# Patient Record
Sex: Male | Born: 1935 | Race: Black or African American | Hispanic: No | Smoking: Never smoker
Health system: Southern US, Community
[De-identification: ages and names within clinical notes are randomized; demographics above are authoritative.]

## PROBLEM LIST (undated history)

## (undated) DIAGNOSIS — M109 Gout, unspecified: Secondary | ICD-10-CM

## (undated) DIAGNOSIS — I10 Essential (primary) hypertension: Secondary | ICD-10-CM

## (undated) DIAGNOSIS — M1990 Unspecified osteoarthritis, unspecified site: Secondary | ICD-10-CM

## (undated) HISTORY — PX: OTHER SURGICAL HISTORY: SHX169

## (undated) HISTORY — PX: CHOLECYSTECTOMY: SHX55

## (undated) HISTORY — PX: KIDNEY STONE SURGERY: SHX686

## (undated) HISTORY — PX: HERNIA REPAIR: SHX51

---

## 2008-03-21 ENCOUNTER — Emergency Department: Payer: Self-pay | Admitting: Emergency Medicine

## 2009-10-18 ENCOUNTER — Emergency Department: Payer: Self-pay | Admitting: Emergency Medicine

## 2013-01-03 ENCOUNTER — Inpatient Hospital Stay: Payer: Self-pay | Admitting: Internal Medicine

## 2013-01-03 LAB — URINALYSIS, COMPLETE
Bilirubin,UR: NEGATIVE
Blood: NEGATIVE
Glucose,UR: NEGATIVE mg/dL (ref 0–75)
Hyaline Cast: 2
Ketone: NEGATIVE
Nitrite: NEGATIVE
Ph: 6 (ref 4.5–8.0)
Protein: NEGATIVE
RBC,UR: 5 /HPF (ref 0–5)
Specific Gravity: 1.021 (ref 1.003–1.030)
Squamous Epithelial: 6
WBC UR: 51 /HPF (ref 0–5)

## 2013-01-03 LAB — COMPREHENSIVE METABOLIC PANEL
Albumin: 3.3 g/dL — ABNORMAL LOW (ref 3.4–5.0)
Alkaline Phosphatase: 135 U/L (ref 50–136)
Anion Gap: 3 — ABNORMAL LOW (ref 7–16)
BUN: 24 mg/dL — ABNORMAL HIGH (ref 7–18)
Bilirubin,Total: 0.7 mg/dL (ref 0.2–1.0)
Calcium, Total: 8.7 mg/dL (ref 8.5–10.1)
Chloride: 102 mmol/L (ref 98–107)
Co2: 33 mmol/L — ABNORMAL HIGH (ref 21–32)
Creatinine: 1.77 mg/dL — ABNORMAL HIGH (ref 0.60–1.30)
EGFR (African American): 41 — ABNORMAL LOW
EGFR (Non-African Amer.): 36 — ABNORMAL LOW
Glucose: 108 mg/dL — ABNORMAL HIGH (ref 65–99)
Osmolality: 280 (ref 275–301)
Potassium: 3.7 mmol/L (ref 3.5–5.1)
SGOT(AST): 23 U/L (ref 15–37)
SGPT (ALT): 16 U/L (ref 12–78)
Sodium: 138 mmol/L (ref 136–145)
Total Protein: 8 g/dL (ref 6.4–8.2)

## 2013-01-03 LAB — CBC
HCT: 40.5 % (ref 40.0–52.0)
HGB: 13.3 g/dL (ref 13.0–18.0)
MCH: 30.6 pg (ref 26.0–34.0)
MCHC: 32.8 g/dL (ref 32.0–36.0)
MCV: 93 fL (ref 80–100)
Platelet: 446 10*3/uL — ABNORMAL HIGH (ref 150–440)
RBC: 4.34 10*6/uL — ABNORMAL LOW (ref 4.40–5.90)
RDW: 13.2 % (ref 11.5–14.5)
WBC: 15.2 10*3/uL — ABNORMAL HIGH (ref 3.8–10.6)

## 2013-01-04 LAB — CBC WITH DIFFERENTIAL/PLATELET
Basophil #: 0.1 10*3/uL (ref 0.0–0.1)
Basophil %: 0.6 %
Eosinophil #: 0.7 10*3/uL (ref 0.0–0.7)
Eosinophil %: 5.8 %
HCT: 33.9 % — ABNORMAL LOW (ref 40.0–52.0)
HGB: 11.4 g/dL — ABNORMAL LOW (ref 13.0–18.0)
Lymphocyte #: 1.7 10*3/uL (ref 1.0–3.6)
Lymphocyte %: 14.8 %
MCH: 30.9 pg (ref 26.0–34.0)
MCHC: 33.8 g/dL (ref 32.0–36.0)
MCV: 92 fL (ref 80–100)
Monocyte #: 1.2 x10 3/mm — ABNORMAL HIGH (ref 0.2–1.0)
Monocyte %: 10.5 %
Neutrophil #: 7.8 10*3/uL — ABNORMAL HIGH (ref 1.4–6.5)
Neutrophil %: 68.3 %
Platelet: 399 10*3/uL (ref 150–440)
RBC: 3.7 10*6/uL — ABNORMAL LOW (ref 4.40–5.90)
RDW: 12.9 % (ref 11.5–14.5)
WBC: 11.4 10*3/uL — ABNORMAL HIGH (ref 3.8–10.6)

## 2013-01-04 LAB — BASIC METABOLIC PANEL
Anion Gap: 5 — ABNORMAL LOW (ref 7–16)
BUN: 25 mg/dL — ABNORMAL HIGH (ref 7–18)
Calcium, Total: 8 mg/dL — ABNORMAL LOW (ref 8.5–10.1)
Chloride: 102 mmol/L (ref 98–107)
Co2: 31 mmol/L (ref 21–32)
Creatinine: 1.64 mg/dL — ABNORMAL HIGH (ref 0.60–1.30)
EGFR (African American): 45 — ABNORMAL LOW
EGFR (Non-African Amer.): 39 — ABNORMAL LOW
Glucose: 108 mg/dL — ABNORMAL HIGH (ref 65–99)
Osmolality: 281 (ref 275–301)
Potassium: 3.5 mmol/L (ref 3.5–5.1)
Sodium: 138 mmol/L (ref 136–145)

## 2013-01-05 LAB — BASIC METABOLIC PANEL
Anion Gap: 5 — ABNORMAL LOW (ref 7–16)
BUN: 22 mg/dL — ABNORMAL HIGH (ref 7–18)
Calcium, Total: 8.3 mg/dL — ABNORMAL LOW (ref 8.5–10.1)
Chloride: 104 mmol/L (ref 98–107)
Co2: 29 mmol/L (ref 21–32)
Creatinine: 1.45 mg/dL — ABNORMAL HIGH (ref 0.60–1.30)
EGFR (African American): 53 — ABNORMAL LOW
EGFR (Non-African Amer.): 45 — ABNORMAL LOW
Glucose: 117 mg/dL — ABNORMAL HIGH (ref 65–99)
Osmolality: 280 (ref 275–301)
Potassium: 3.7 mmol/L (ref 3.5–5.1)
Sodium: 138 mmol/L (ref 136–145)

## 2013-01-05 LAB — URINE CULTURE

## 2014-01-27 ENCOUNTER — Emergency Department: Payer: Self-pay | Admitting: Emergency Medicine

## 2014-01-27 LAB — URINALYSIS, COMPLETE
Bilirubin,UR: NEGATIVE
Blood: NEGATIVE
Glucose,UR: NEGATIVE mg/dL (ref 0–75)
Hyaline Cast: 2
Ketone: NEGATIVE
Nitrite: NEGATIVE
Ph: 5 (ref 4.5–8.0)
Protein: 30
RBC,UR: 3 /HPF (ref 0–5)
Specific Gravity: 1.017 (ref 1.003–1.030)
Squamous Epithelial: 1
WBC UR: 13 /HPF (ref 0–5)

## 2014-01-27 LAB — CBC WITH DIFFERENTIAL/PLATELET
Basophil #: 0.1 10*3/uL (ref 0.0–0.1)
Basophil %: 0.6 %
Eosinophil #: 0.1 10*3/uL (ref 0.0–0.7)
Eosinophil %: 1.5 %
HCT: 42.9 % (ref 40.0–52.0)
HGB: 14 g/dL (ref 13.0–18.0)
Lymphocyte #: 1 10*3/uL (ref 1.0–3.6)
Lymphocyte %: 10.2 %
MCH: 30.9 pg (ref 26.0–34.0)
MCHC: 32.7 g/dL (ref 32.0–36.0)
MCV: 95 fL (ref 80–100)
Monocyte #: 0.6 x10 3/mm (ref 0.2–1.0)
Monocyte %: 6.3 %
Neutrophil #: 7.7 10*3/uL — ABNORMAL HIGH (ref 1.4–6.5)
Neutrophil %: 81.4 %
Platelet: 281 10*3/uL (ref 150–440)
RBC: 4.54 10*6/uL (ref 4.40–5.90)
RDW: 13 % (ref 11.5–14.5)
WBC: 9.5 10*3/uL (ref 3.8–10.6)

## 2014-01-27 LAB — COMPREHENSIVE METABOLIC PANEL
Albumin: 3.3 g/dL — ABNORMAL LOW (ref 3.4–5.0)
Alkaline Phosphatase: 129 U/L — ABNORMAL HIGH
Anion Gap: 4 — ABNORMAL LOW (ref 7–16)
BUN: 23 mg/dL — ABNORMAL HIGH (ref 7–18)
Bilirubin,Total: 0.6 mg/dL (ref 0.2–1.0)
Calcium, Total: 8.1 mg/dL — ABNORMAL LOW (ref 8.5–10.1)
Chloride: 104 mmol/L (ref 98–107)
Co2: 31 mmol/L (ref 21–32)
Creatinine: 1.68 mg/dL — ABNORMAL HIGH (ref 0.60–1.30)
EGFR (African American): 51 — ABNORMAL LOW
EGFR (Non-African Amer.): 42 — ABNORMAL LOW
Glucose: 128 mg/dL — ABNORMAL HIGH (ref 65–99)
Osmolality: 283 (ref 275–301)
Potassium: 3.9 mmol/L (ref 3.5–5.1)
SGOT(AST): 22 U/L (ref 15–37)
SGPT (ALT): 20 U/L
Sodium: 139 mmol/L (ref 136–145)
Total Protein: 8.2 g/dL (ref 6.4–8.2)

## 2014-01-27 LAB — TROPONIN I: Troponin-I: <0.02 ng/mL

## 2014-03-02 ENCOUNTER — Emergency Department: Payer: Self-pay | Admitting: Emergency Medicine

## 2014-06-10 NOTE — Discharge Summary (Signed)
PATIENT NAME:  Richard HarveySPRINGFIELD, Richard Cervantes MR#:  [id number] DATE OF BIRTH:  [birth year]  DATE OF ADMISSION:  01/03/2013 DATE OF DISCHARGE:  01/05/2013  ADMITTING PHYSICIAN: Sital P. Juliene PinaMody, MD  DISCHARGING PHYSICIAN: Enid Baasadhika Julissa Browning, MD  PRIMARY CARE PHYSICIAN: Serita Shellerrnest B. Maryellen PileEason, MD  CONSULTATIONS IN THE HOSPITAL: None.   DISCHARGE DIAGNOSES: 1.  Left lower extremity cellulitis.  2.  Hypertension.  3.  Gout.  4.  Arthritis.  5.  Acute renal failure while in the hospital.  DISCHARGE HOME MEDICATIONS:  1.  Meloxicam 7.5 mg p.o. daily.  2.  Lisinopril 10 mg p.o. daily.  3.  Atenolol 100 mg p.o. daily.  4.  Amlodipine 5 mg p.o. daily.  5.  Clindamycin 300 mg p.o. q.8 hours for 8 more days.   DISCHARGE HOME OXYGEN: None.   DISCHARGE DIET: Low-sodium diet.   DISCHARGE ACTIVITY: As tolerated.    FOLLOWUP INSTRUCTIONS: PCP followup in 1 to 2 weeks. Also advised to use moisturizer for his dry skin.   LABORATORY AND IMAGING STUDIES PRIOR TO DISCHARGE: WBC 11.4, hemoglobin 11.1, hematocrit 33.9, platelet count 399.   Sodium 138, potassium 3.7, chloride 104, bicarbonate 29, BUN 22, creatinine 1.45, glucose 117 and calcium of 8.3.   Ultrasound Doppler of the lower extremities showing no evidence of any DVT.   Urinalysis with 2+ leukocyte esterase, and the patient has received Rocephin for the same in the hospital. Urine cultures were negative.   BRIEF HOSPITAL COURSE: Richard Cervantes is a 81 year old African American male with past medical history significant for hypertension, gout and arthritis, who has been having left lower extremity swelling and redness for the past week prior to admission.  1.  Left lower extremity cellulitis: He was treated with Levaquin as an outpatient, did not improve his swelling. Doppler in the hospital showed negative for DVT. He was started on clindamycin that improved his swelling and erythema, so he is being discharged on clindamycin for the same.  2.  He was also  noted to have a trace UTI with urine cultures being negative, and the patient was on Rocephin while in the hospital.  3.  The patient's course has been otherwise uneventful in the hospital. He was noted to have renal failure on admission, likely prerenal from ATN. With IV fluids, it improved and will follow up as an outpatient.  4.  Hypertension: He is on lisinopril, atenolol. and amlodipine was added at the time of discharge. The patient was taking Lasix at home, which was stopped due to his renal failure, and will follow up with his PCP.   His course has been otherwise uneventful in the hospital.   DISCHARGE CONDITION: Stable.   DISCHARGE DISPOSITION: Home.   TIME SPENT ON DISCHARGE: 40 minutes.  ____________________________ Enid Baasadhika Hanya Guerin, MD rk:jcm D: 01/05/2013 14:04:36 ET T: 01/05/2013 15:29:59 ET JOB#: [id number]  cc: Enid Baasadhika Alene Bergerson, MD, <Dictator> Serita ShellerErnest B. Maryellen PileEason, MD Enid BaasADHIKA Nalaya Wojdyla MD ELECTRONICALLY SIGNED 01/12/2013 18:16

## 2014-06-10 NOTE — H&P (Signed)
PATIENT NAME:  Richard Cervantes, Richard Cervantes MR#:  [id number] DATE OF BIRTH:  [birth year]  DATE OF ADMISSION:  01/03/2013  PRIMARY CARE PHYSICIAN: Dr. Brynda Greathouse.   CHIEF COMPLAINT: Left lower extremity cellulitis.   HISTORY OF PRESENT ILLNESS:  This is a 81 year old male who was seen by his primary care physician earlier this week for left leg swelling and cellulitis. He was prescribed Levaquin on Friday. Since that time, the swelling has persisted and the leg is more painful. He came to the ER for further evaluation. Lower extremity Doppler was negative for DVT, started on clindamycin. He was also diagnosed with a UTI.    REVIEW OF SYSTEMS: CONSTITUTIONAL: Positive fever and chills. No fatigue, weakness.  EYES: No blurred or double vision, glaucoma.  ENT: No ear pain, hearing loss, seasonal allergies.  RESPIRATORY:  No cough, wheezing, hemoptysis, COPD.   CARDIOVASCULAR:  No chest pain, no palpitations, syncope, arrhythmia and dyspnea on exertion.  GASTROINTESTINAL:  No nausea, vomiting, diarrhea, abdominal pain, melena or ulcers.  GENITOURINARY:  No dysuria, hematuria, frequency or urgency.  ENDOCRINE: No polyuria or polydipsia.  HEMATOLOGIC AND LYMPHATIC:  No easy bruising or bleeding.  SKIN:  Positive cellulitis of the left leg.  MUSCULOSKELETAL: Positive history of gout.  NEUROLOGICAL:  No history of CVA, TIA or seizures.  PSYCHIATRIC: No history of anxiety or depression.   PAST MEDICAL HISTORY: 1.  Hypertension.  2.  Gout. 3.  Arthritis.   MEDICATIONS:  1.  Tenormin 100 mg daily.  2.  KCl 10 mEq daily.  3.  The patient was on Levaquin 750 mg q.24 hours. 4.  Lasix 80 mg daily.   ALLERGIES: No known drug allergies.   SOCIAL HISTORY:  No tobacco, alcohol or drug use.   PAST SURGICAL HISTORY: None.   FAMILY HISTORY: No history of heart disease or hypertension.   PHYSICAL EXAMINATION: VITAL SIGNS: Temperature 98.3, pulse 98, respirations 18, blood pressure 169/75, 98% on room air.   GENERAL: The patient is alert, oriented, not in acute distress.  HEENT: Head is atraumatic. Pupils are round. Sclerae anicteric. Mucous membranes are moist. Oropharynx identify clear.  NECK: Supple without JVD, carotid bruit or enlarged thyroid.  CARDIOVASCULAR:  Tachycardia without murmur, gallops or rubs. PMI is not displaced.  LUNGS: Clear to auscultation without crackles, rales, rhonchi or wheezing. Normal to percussion.  ABDOMEN: Bowel sounds are positive. Nontender, nondistended. No hepatomegaly.  EXTREMITIES: He has got 3+ pitting edema on the left leg. He has got soreness of the left calf. Negative Homans sign.  SKIN:  The left leg is warm, tender to touch, very dry and scaly. The skin is much darker than the right leg. He has some abrasions on the left and right leg.  NEUROLOGY:  Cranial nerves II through XII are intact. There are no focal deficits. Motor strength is normal. Sensation is normal.   LABORATORY, DIAGNOSTIC AND RADIOLOGICAL DATA: White blood cells 15.2, hemoglobin 13.3, hematocrit 40.5, platelets 446. Sodium 138, potassium 3.7, chloride 102, bicarb 33, BUN 24, creatinine 1.77, glucose is 108, calcium 8.7, bilirubin 0.7, alk phos 135, ALT 16, AST 23, albumin 3.3, blood glucose is 118. Urinalysis shows 2+ LCE, negative nitrites with 51 white blood cells. Dopplers of the lower extremities negative for DVT. He has got soft tissue edema at the level of the ankle.   ASSESSMENT AND PLAN: A 81 year old male who has failed outpatient treatment for cellulitis.  1.  Left lower extremity cellulitis with negative Dopplers for deep venous thrombosis. The patient  will be on clindamycin, elevate the leg, monitor his response to clindamycin.  2.  Urinary tract infection. A urine culture has been ordered. Will order Rocephin.  3.  Accelerated hypertension. Will restart his outpatient medications and write hydralazine p.r.n. He may need adjustment to blood pressure medications at discharge.  4.   Acute kidney injury. We will provide IV fluids, monitor his creatinine.  5.  CODE STATUS: The patient is a full CODE STATUS.    TIME SPENT: 45 minutes.    ____________________________ Donell Beers. Benjie Karvonen, MD spm:cs D: 01/03/2013 18:50:08 ET T: 01/03/2013 19:08:45 ET JOB#: [id number]  cc: Bear Osten P. Benjie Karvonen, MD, <Dictator> Mikeal Hawthorne. Brynda Greathouse, MD Donell Beers Urvi Imes MD ELECTRONICALLY SIGNED 01/03/2013 20:58

## 2017-03-14 ENCOUNTER — Emergency Department
Admission: EM | Admit: 2017-03-14 | Discharge: 2017-03-14 | Disposition: A | Discharge status: Home / Self Care | Payer: Medicare HMO | Attending: Emergency Medicine | Admitting: Emergency Medicine

## 2017-03-14 ENCOUNTER — Other Ambulatory Visit: Payer: Self-pay

## 2017-03-14 ENCOUNTER — Encounter: Payer: Self-pay | Admitting: Emergency Medicine

## 2017-03-14 DIAGNOSIS — I10 Essential (primary) hypertension: Secondary | ICD-10-CM | POA: Diagnosis not present

## 2017-03-14 DIAGNOSIS — Z79899 Other long term (current) drug therapy: Secondary | ICD-10-CM | POA: Diagnosis not present

## 2017-03-14 DIAGNOSIS — M109 Gout, unspecified: Secondary | ICD-10-CM | POA: Insufficient documentation

## 2017-03-14 HISTORY — DX: Essential (primary) hypertension: I10

## 2017-03-14 NOTE — ED Notes (Signed)
See triage note   Presents with elevated b/p  States last pm his b/p was over 200/  On arrival to ed 178/94  Denies any sxs'  Unsure of name of b/p meds

## 2017-03-14 NOTE — Discharge Instructions (Signed)
Follow-up with your regular doctor.  Have your lab work done today.  Continue to take your blood pressure medicine as prescribed.  If your blood pressure goes back up over 200 please return to the emergency department

## 2017-03-14 NOTE — ED Triage Notes (Signed)
First Nurse Note:  C/O elevated blood pressure.  Seen by PCP yesterday for same.  Last night BP was 219/87.  Patient AAOx3.  Skin warm and dry. NAD.

## 2017-03-14 NOTE — ED Triage Notes (Addendum)
Pt here with c/o hypertension, states was seen at PCP's office with systolic over 200, was told to come to ED yesterday.  Pt took BP medication last night after being out of medication for 4 days.  Pt denies any s/s of HTN, Denies CP, vision changes, SOB, HA

## 2017-03-14 NOTE — ED Provider Notes (Signed)
[facility] Emergency Department Provider Note  ____________________________________________   First MD Initiated Contact with Patient 03/14/17 1007     (approximate)  I have reviewed the triage vital signs and the nursing notes.   HISTORY  Chief Complaint Hypertension    HPI Richard Cervantes is a 84 y.o. male is here due to elevated high blood pressure.  He states he was at his doctor's office yesterday and his blood pressure was over 200.  He was told to come to the emergency room last night.  He states instead he went to the pharmacy got his blood pressure medicine and took that.  He has not had his blood pressure medicine for over 4 days.  Also he was hurting last night due to his gout.  He also pick that medication for that.  He has a lab slip to get blood work done at [facility] from his primary care doctor.  He denies chest pain or shortness of breath.  He denies any swelling in his legs or ankles.  He is able to lie flat and sleep.  He denies using extra pillows.  Past Medical History:  Diagnosis Date  . Hypertension     There are no active problems to display for this patient.   History reviewed. No pertinent surgical history.  Prior to Admission medications   Medication Sig Start Date End Date Taking? Authorizing Provider  diltiazem (TIAZAC) 300 MG 24 hr capsule Take 300 mg by mouth daily.   Yes [provider]  lisinopril-hydrochlorothiazide (PRINZIDE,ZESTORETIC) 20-25 MG tablet Take 1 tablet by mouth daily.   Yes [provider]    Allergies Patient has no known allergies.  History reviewed. No pertinent family history.  Social History Social History   Tobacco Use  . Smoking status: Never Smoker  . Smokeless tobacco: Never Used  Substance Use Topics  . Alcohol use: No    Frequency: Never  . Drug use: No    Review of Systems  Constitutional: No fever/chills, mild light headedness yesterday, none today, denies  headache or stroke like sx Eyes: No visual changes. ENT: No sore throat.  Respiratory: positive cough Cardiac: no cp/sob ABD: denies abd pain Genitourinary: Negative for dysuria. Musculoskeletal: Negative for back pain. Skin: Negative for rash.    ____________________________________________   PHYSICAL EXAM:  VITAL SIGNS: ED Triage Vitals [03/14/17 0954]  Enc Vitals Group     BP (!) 178/94     Pulse Rate 99     Resp 18     Temp 98.3 F (36.8 C)     Temp Source Oral     SpO2 97 %     Weight      Height      Head Circumference      Peak Flow      Pain Score 0     Pain Loc      Pain Edu?      Excl. in GC?     Constitutional: Alert and oriented. Well appearing and in no acute distress.  Answers all questions in an appropriate manner Eyes: Conjunctivae are normal. perrl Head: Atraumatic. Nose: No congestion/rhinnorhea. Mouth/Throat: Mucous membranes are moist.  Throat appears normal NECK: supple no lymph, no bruits noted Cardiovascular: Normal rate, regular rhythm.  Heart sounds are normal Respiratory: Normal respiratory effort.  No retractions, lungs clear to auscultation GU: deferred Musculoskeletal: FROM all extremities, warm and well perfused, no pedal edema or pitting edema noted Neurologic:  Normal speech and  language.  Neurovascular is intact.  Cranial nerves II through XII are grossly intact Skin:  Skin is warm, dry and intact. No rash noted. Psychiatric: Mood and affect are normal. Speech and behavior are normal.  ____________________________________________   LABS (all labs ordered are listed, but only abnormal results are displayed)  Labs Reviewed - No data to display ____________________________________________   ____________________________________________  RADIOLOGY    ____________________________________________   PROCEDURES  Procedure(s) performed: No  Procedures    ____________________________________________   INITIAL  IMPRESSION / ASSESSMENT AND PLAN / ED COURSE  Pertinent labs & imaging results that were available during my care of the patient were reviewed by me and considered in my medical decision making (see chart for details).  Patient is an 84 year old male that was instructed by his primary care doctor yesterday to come to the ER last night due to elevated blood pressure.  He states he has been out of his medication for 4 days and he was hurting due to a gout flare.  He went to the drugstore last night to get a refill on his prescription and got medication for the gout.  He states his blood pressure is better today.  He denies any slurred speech, headache, he denies chest pain or shortness of breath.  He denies any congestive heart failure symptoms.   On physical exam he appears well, his blood pressure has decreased to 178/94.  The lungs are clear to auscultation, heart sounds are normal, there is no edema in the lower extremities.  Cranial nerves II through XII are grossly intact  Discussed blood pressure medication with the patient and his daughter.  At this time since he has been out of medication and think it is prudent for him to get back on his current dosage and to journal his blood pressure readings.  His daughter can take his blood pressure at home.  The patient states he agrees.  He said he is never had any trouble with blood pressure before until he ran out of his medication.  The only reason he ran out of his medication was due to [name] retiring and waiting for the new provider.  Also the blood pressure may be elevated due to pain related to gout.  He is to follow-up with his regular doctor.  He is to go to [facility] today to have his blood drawn for the routine labs his doctor has already ordered.  He is to take his medication as prescribed.  He is to journal the medication and the blood pressure readings.  If he is worsening they are to return to the emergency department.  Otherwise he is to  follow-up with his regular doctor.  The patient and daughter agree that this is a good treatment plan.  They will follow with our recommendations.  He was discharged in stable condition. As part of my medical decision making, I reviewed the following data within the electronic MEDICAL RECORD NUMBER History obtained from family, Nursing notes reviewed and incorporated, Old chart reviewed, Notes from prior ED visits and [place] Controlled Substance Database  ____________________________________________   FINAL CLINICAL IMPRESSION(S) / ED DIAGNOSES  Final diagnoses:  Essential hypertension      NEW MEDICATIONS STARTED DURING THIS VISIT:  Discharge Medication List as of 03/14/2017 10:24 AM       Note:  This document was prepared using Dragon voice recognition software and may include unintentional dictation errors.    Faythe Ghee, PA-C 03/14/17 1310    Quale,  Loraine Leriche, MD 03/14/17 1558

## 2017-03-17 ENCOUNTER — Emergency Department
Admission: EM | Admit: 2017-03-17 | Discharge: 2017-03-17 | Disposition: A | Discharge status: Home / Self Care | Payer: Medicare HMO | Attending: Emergency Medicine | Admitting: Emergency Medicine

## 2017-03-17 ENCOUNTER — Encounter: Payer: Self-pay | Admitting: Intensive Care

## 2017-03-17 ENCOUNTER — Emergency Department: Payer: Medicare HMO

## 2017-03-17 DIAGNOSIS — R200 Anesthesia of skin: Secondary | ICD-10-CM | POA: Diagnosis not present

## 2017-03-17 DIAGNOSIS — N183 Chronic kidney disease, stage 3 unspecified: Secondary | ICD-10-CM

## 2017-03-17 DIAGNOSIS — Z79899 Other long term (current) drug therapy: Secondary | ICD-10-CM | POA: Diagnosis not present

## 2017-03-17 DIAGNOSIS — I129 Hypertensive chronic kidney disease with stage 1 through stage 4 chronic kidney disease, or unspecified chronic kidney disease: Secondary | ICD-10-CM | POA: Insufficient documentation

## 2017-03-17 DIAGNOSIS — R0602 Shortness of breath: Secondary | ICD-10-CM | POA: Insufficient documentation

## 2017-03-17 DIAGNOSIS — I10 Essential (primary) hypertension: Secondary | ICD-10-CM

## 2017-03-17 HISTORY — DX: Gout, unspecified: M10.9

## 2017-03-17 HISTORY — DX: Unspecified osteoarthritis, unspecified site: M19.90

## 2017-03-17 LAB — BASIC METABOLIC PANEL
Anion gap: 9 (ref 5–15)
BUN: 32 mg/dL — ABNORMAL HIGH (ref 6–20)
CO2: 25 mmol/L (ref 22–32)
Calcium: 8.7 mg/dL — ABNORMAL LOW (ref 8.9–10.3)
Chloride: 100 mmol/L — ABNORMAL LOW (ref 101–111)
Creatinine, Ser: 1.8 mg/dL — ABNORMAL HIGH (ref 0.61–1.24)
GFR calc Af Amer: 38 mL/min — ABNORMAL LOW (ref >60)
GFR calc non Af Amer: 33 mL/min — ABNORMAL LOW (ref >60)
Glucose, Bld: 112 mg/dL — ABNORMAL HIGH (ref 65–99)
Potassium: 3.9 mmol/L (ref 3.5–5.1)
Sodium: 134 mmol/L — ABNORMAL LOW (ref 135–145)

## 2017-03-17 LAB — URINALYSIS, COMPLETE (UACMP) WITH MICROSCOPIC
Bilirubin Urine: NEGATIVE
Glucose, UA: NEGATIVE mg/dL
Hgb urine dipstick: NEGATIVE
Ketones, ur: NEGATIVE mg/dL
Nitrite: NEGATIVE
Protein, ur: NEGATIVE mg/dL
Specific Gravity, Urine: 1.02 (ref 1.005–1.030)
pH: 5 (ref 5.0–8.0)

## 2017-03-17 LAB — CBC
HCT: 37.7 % — ABNORMAL LOW (ref 40.0–52.0)
Hemoglobin: 12.6 g/dL — ABNORMAL LOW (ref 13.0–18.0)
MCH: 31.3 pg (ref 26.0–34.0)
MCHC: 33.4 g/dL (ref 32.0–36.0)
MCV: 93.6 fL (ref 80.0–100.0)
Platelets: 391 10*3/uL (ref 150–440)
RBC: 4.02 MIL/uL — ABNORMAL LOW (ref 4.40–5.90)
RDW: 13 % (ref 11.5–14.5)
WBC: 9.1 10*3/uL (ref 3.8–10.6)

## 2017-03-17 LAB — TROPONIN I: Troponin I: <0.03 ng/mL (ref <0.03)

## 2017-03-17 MED ORDER — CLONIDINE HCL 0.1 MG PO TABS
0.1000 mg | ORAL_TABLET | Freq: Once | ORAL | Status: AC
  Administered 2017-03-17: 0.1 mg via ORAL
  Filled 2017-03-17: qty 1

## 2017-03-17 MED ORDER — CLONIDINE HCL 0.1 MG PO TABS: 0.1000 mg | ORAL_TABLET | Freq: Two times a day (BID) | ORAL | 0 refills | Status: DC

## 2017-03-17 NOTE — ED Notes (Signed)
Se triage note  Presents today for elevated b/p  States noticed some slight SOB and some numbness to left hand which started couple of days ago  States his b/p was over 200/

## 2017-03-17 NOTE — ED Provider Notes (Signed)
[facility] Emergency Department Provider Note  ____________________________________________  Time seen: Approximately 8:07 PM  I have reviewed the triage vital signs and the nursing notes.   HISTORY  Chief Complaint Hypertension; Numbness (L hand X months); and Shortness of Breath   HPI Richard Cervantes is a 84 y.o. male who presents to the emergency department for evaluation of hypertension.  He states that he has had readings over 200 at home.  He recently established care with a new primary care provider and has had his initial evaluation.  He was sent for outpatient labs Thursday of last week, and does not have any of the results.  No recent medication changes.  He states that the numbness in his left hand has been present for the past "10 years."  This has not changed and is not worse when he notices that his blood pressure is high.  He also states that the shortness of breath that he has experienced is an intermittent issue that has also been present for several months for which he has had a thorough workup and believes it is due to "old age."  Patient denies other symptoms of concern including headache, blurred vision, chest pain.  He states that he "feels about normal."  Daughter voices concern that the blood pressure rises above 200.  Patient denies changes in the symptoms when the readings are this high.  Past Medical History:  Diagnosis Date  . Arthritis   . Gout   . Hypertension     There are no active problems to display for this patient.   History reviewed. No pertinent surgical history.  Prior to Admission medications   Medication Sig Start Date End Date Taking? Authorizing Provider  cloNIDine (CATAPRES) 0.1 MG tablet Take 1 tablet (0.1 mg total) by mouth 2 (two) times daily. 03/17/17 03/17/18  Breckyn Troyer, Kasandra Knudsen, FNP  diltiazem (TIAZAC) 300 MG 24 hr capsule Take 300 mg by mouth daily.    [provider]  lisinopril-hydrochlorothiazide  (PRINZIDE,ZESTORETIC) 20-25 MG tablet Take 1 tablet by mouth daily.    [provider]    Allergies Patient has no known allergies.  History reviewed. No pertinent family history.  Social History Social History   Tobacco Use  . Smoking status: Never Smoker  . Smokeless tobacco: Never Used  Substance Use Topics  . Alcohol use: No    Frequency: Never  . Drug use: No    Review of Systems Constitutional: Negative for fever. ENT: Negative for sore throat. Respiratory: Breath sounds clear to auscultation. Gastrointestinal: No abdominal pain.  No nausea, no vomiting.  No diarrhea.  Musculoskeletal: Negative for generalized body aches. Skin: Negative for rash/lesion/wound. Neurological: Negative for headaches, focal weakness or positive for chronic paresthesia of the left hand.  ____________________________________________   PHYSICAL EXAM:  VITAL SIGNS: ED Triage Vitals  Enc Vitals Group     BP 03/17/17 1352 (!) 200/72     Pulse Rate 03/17/17 1352 85     Resp 03/17/17 1352 16     Temp 03/17/17 1352 98.2 F (36.8 C)     Temp Source 03/17/17 1352 Oral     SpO2 03/17/17 1352 98 %     Weight 03/17/17 1353 225 lb (102.1 kg)     Height 03/17/17 1353 5\' 9"  (1.753 m)     Head Circumference --      Peak Flow --      Pain Score --      Pain Loc --  Pain Edu? --      Excl. in GC? --     Constitutional: Alert and oriented. Well appearing and in no acute distress. Eyes: Conjunctivae are normal. Head: Atraumatic. Nose: No congestion/rhinnorhea. Mouth/Throat: Mucous membranes are moist. Neck: No stridor.  Cardiovascular: Normal rate, regular rhythm. Good peripheral circulation. Respiratory: Normal respiratory effort. Musculoskeletal: Full ROM throughout.  Neurologic:  Normal speech and language. No gross focal neurologic deficits are appreciated. Speech is normal. No gait instability. Grip strength is equal. Skin:  Skin is warm, dry and intact. No rash  noted. Psychiatric: Mood and affect are normal. Speech and behavior are normal.  ____________________________________________   LABS (all labs ordered are listed, but only abnormal results are displayed)  Labs Reviewed  BASIC METABOLIC PANEL - Abnormal; Notable for the following components:      Result Value   Sodium 134 (*)    Chloride 100 (*)    Glucose, Bld 112 (*)    BUN 32 (*)    Creatinine, Ser 1.80 (*)    Calcium 8.7 (*)    GFR calc non Af Amer 33 (*)    GFR calc Af Amer 38 (*)    All other components within normal limits  CBC - Abnormal; Notable for the following components:   RBC 4.02 (*)    Hemoglobin 12.6 (*)    HCT 37.7 (*)    All other components within normal limits  URINALYSIS, COMPLETE (UACMP) WITH MICROSCOPIC - Abnormal; Notable for the following components:   Color, Urine YELLOW (*)    APPearance HAZY (*)    Leukocytes, UA TRACE (*)    Bacteria, UA RARE (*)    Squamous Epithelial / LPF 0-5 (*)    All other components within normal limits  TROPONIN I   ____________________________________________  EKG  ED ECG REPORT I, Jamont Mellin, FNP-C, personally viewed and interpreted this ECG.  Date: 03/17/2017 EKG Time: 1:59 Rate: 81 Rhythm: normal sinus rhythm QRS Axis: normal Intervals: normal ST/T Wave abnormalities: normal Narrative Interpretation: no evidence of acute ischemia  ____________________________________________  RADIOLOGY  No acute cardiopulmonary abnormality per radiology specifically, heart size is normal. ____________________________________________   PROCEDURES  None ____________________________________________   INITIAL IMPRESSION / ASSESSMENT AND PLAN / ED COURSE  Clinical Course as of Mar 17 2013  Mon Mar 17, 2017  1646 Chart review of prior lab studies indicates CKD and elevated BUN and Creatinine are near patient's baseline.  [CT]    Clinical Course User Index [CT] Chinita Pester, FNP   84 year old male  presenting to the emergency department for evaluation and treatment of persistent hypertension.  While here, Catapres 0.1 mg tablet was given with slight reduction of the blood pressure.  He will be given a prescription for the same and encouraged to keep a log of the blood pressure readings at home.  He was advised to call his primary care provider tomorrow to check on the status of the labs that were drawn last week.  Signs and symptoms of concern were reviewed with the patient and his daughter.  They are to return to the emergency department if any of those concerns arise and they are unable to schedule an appointment with primary care.  Pertinent labs & imaging results that were available during my care of the patient were reviewed by me and considered in my medical decision making (see chart for details).  ____________________________________________   FINAL CLINICAL IMPRESSION(S) / ED DIAGNOSES  Final diagnoses:  Essential hypertension  Stage  3 chronic kidney disease (HCC)       Chinita Pesterriplett, Mackenzy Eisenberg B, FNP 03/17/17 2015    Phineas SemenGoodman, Graydon, MD 03/17/17 [phone]

## 2017-03-17 NOTE — ED Triage Notes (Signed)
FIRST NURSE NOTE-BP been elevated, 200s/90s since Friday per pt with tingling in left hand.  Ambulatory. NAD. Has been taking BP meds

## 2017-03-17 NOTE — ED Provider Notes (Signed)
ED ECG REPORT I, Jene Everyobert Issabela Lesko, the attending physician, personally viewed and interpreted this ECG.  Date: 03/17/2017  Rhythm: normal sinus rhythm QRS Axis: normal Intervals: First-degree AV block ST/T Wave abnormalities: normal Narrative Interpretation: no evidence of acute ischemia    Jene EveryKinner, Cici Rodriges, MD 03/17/17 2208

## 2017-03-17 NOTE — ED Triage Notes (Signed)
Patient presents today with c/o SOB for couple days, HTN and L hand numbness that has been going on for months. Reports taking his b/p medicine today. Reports he has been seen for hand numbness and was told it is due to poor circulation. Denies chest pain.

## 2017-07-15 ENCOUNTER — Encounter: Payer: Self-pay | Admitting: Emergency Medicine

## 2017-07-15 ENCOUNTER — Emergency Department
Admission: EM | Admit: 2017-07-15 | Discharge: 2017-07-15 | Disposition: A | Discharge status: Home / Self Care | Payer: Medicare HMO | Attending: Emergency Medicine | Admitting: Emergency Medicine

## 2017-07-15 DIAGNOSIS — Z760 Encounter for issue of repeat prescription: Secondary | ICD-10-CM | POA: Diagnosis not present

## 2017-07-15 DIAGNOSIS — I129 Hypertensive chronic kidney disease with stage 1 through stage 4 chronic kidney disease, or unspecified chronic kidney disease: Secondary | ICD-10-CM | POA: Diagnosis not present

## 2017-07-15 DIAGNOSIS — M1A3410 Chronic gout due to renal impairment, right hand, without tophus (tophi): Secondary | ICD-10-CM | POA: Diagnosis not present

## 2017-07-15 DIAGNOSIS — M10341 Gout due to renal impairment, right hand: Secondary | ICD-10-CM

## 2017-07-15 DIAGNOSIS — N183 Chronic kidney disease, stage 3 unspecified: Secondary | ICD-10-CM

## 2017-07-15 DIAGNOSIS — Z79899 Other long term (current) drug therapy: Secondary | ICD-10-CM | POA: Diagnosis not present

## 2017-07-15 DIAGNOSIS — M79641 Pain in right hand: Secondary | ICD-10-CM | POA: Diagnosis present

## 2017-07-15 MED ORDER — COLCHICINE 0.6 MG PO TABS: 0.6000 mg | ORAL_TABLET | Freq: Every day | ORAL | 0 refills | Status: DC

## 2017-07-15 MED ORDER — COLCHICINE 0.6 MG PO TABS
1.8000 mg | ORAL_TABLET | Freq: Once | ORAL | Status: AC
  Administered 2017-07-15: 1.8 mg via ORAL
  Filled 2017-07-15: qty 3

## 2017-07-15 NOTE — ED Provider Notes (Signed)
[facility] Emergency Department Provider Note  ____________________________________________  Time seen: Approximately 7:52 PM  I have reviewed the triage vital signs and the nursing notes.   HISTORY  Chief Complaint Medication Refill and Gout    HPI Richard Cervantes is a 84 y.o. male who presents the emergency department requesting a refill of colchicine for his gout.  Patient reports that his primary care has retired, he is scheduled to see a new primary care in 2 days.  Patient reports that he takes colchicine on a daily basis to prevent gout.  Last gout flare was a year ago.  Patient is having gout in his right hand and right ankle.  Patient is requesting refill of medication until he can see his primary care. Patient does have a history of gout, hypertension, and stage III kidney failure.  After lengthy discussion about colchicine and kidney failure, patient verbalizes that he did not understand the risk of taking daily colchicine but was instructed to do so by his primary care.  At this time, colchicine will be provided as patient is experiencing an acute flare.  Patient will discuss daily colchicine versus other alternative to manage his gout with his primary care in 2 days.  Patient denies any other complaints at this time.  Past Medical History:  Diagnosis Date  . Arthritis   . Gout   . Hypertension     There are no active problems to display for this patient.   History reviewed. No pertinent surgical history.  Prior to Admission medications   Medication Sig Start Date End Date Taking? Authorizing Provider  cloNIDine (CATAPRES) 0.1 MG tablet Take 1 tablet (0.1 mg total) by mouth 2 (two) times daily. 03/17/17 03/17/18  Triplett, Rulon Eisenmenger B, FNP  colchicine 0.6 MG tablet Take 1 tablet (0.6 mg total) by mouth daily. Take 1 tab daily for at least 6 more days. If  Symptoms persist past 6 days continue to use until prescription is finished 07/15/17   Cuthriell,  Delorise Royals, PA-C  diltiazem (TIAZAC) 300 MG 24 hr capsule Take 300 mg by mouth daily.    [provider]  lisinopril-hydrochlorothiazide (PRINZIDE,ZESTORETIC) 20-25 MG tablet Take 1 tablet by mouth daily.    [provider]    Allergies Patient has no known allergies.  No family history on file.  Social History Social History   Tobacco Use  . Smoking status: Never Smoker  . Smokeless tobacco: Never Used  Substance Use Topics  . Alcohol use: No    Frequency: Never  . Drug use: No     Review of Systems  Constitutional: No fever/chills Eyes: No visual changes. No discharge ENT: No upper respiratory complaints. Cardiovascular: no chest pain. Respiratory: no cough. No SOB. Gastrointestinal: No abdominal pain.  No nausea, no vomiting.   Musculoskeletal: Positive for right wrist and right ankle pain consistent with gout flare Skin: Negative for rash, abrasions, lacerations, ecchymosis. Neurological: Negative for headaches, focal weakness or numbness. 10-point ROS otherwise negative.  ____________________________________________   PHYSICAL EXAM:  VITAL SIGNS: ED Triage Vitals  Enc Vitals Group     BP 07/15/17 1757 (!) 177/56     Pulse Rate 07/15/17 1757 (!) 103     Resp 07/15/17 1757 20     Temp 07/15/17 1757 98.3 F (36.8 C)     Temp Source 07/15/17 1757 Oral     SpO2 07/15/17 1757 97 %     Weight 07/15/17 1758 205 lb (93 kg)  Height 07/15/17 1758  (1.753 m)     Head Circumference --      Peak Flow --      Pain Score 07/15/17 1758 10     Pain Loc --      Pain Edu? --      Excl. in GC? --      Constitutional: Alert and oriented. Well appearing and in no acute distress. Eyes: Conjunctivae are normal. PERRL. EOMI. Head: Atraumatic. Neck: No stridor.    Cardiovascular: Normal rate, regular rhythm. Normal S1 and S2.  Good peripheral circulation. Respiratory: Normal respiratory effort without tachypnea or retractions. Lungs CTAB. Good air  entry to the bases with no decreased or absent breath sounds. Musculoskeletal: Full range of motion to all extremities. No gross deformities appreciated.  Edema, warmth noted to the right wrist consistent with gout flare.  No indication of skin changes consistent with cellulitis.  Patient has limited range of motion to the right wrist due to pain.  Sensation capillary refill intact all 5 digits.  Patient with edema and warmth to the right ankle.  Limited range of motion due to pain.  Patient has tenderness to palpation the area with no palpable abnormality.  Dorsalis pedis pulse intact.  Sensation intact all digits. Neurologic:  Normal speech and language. No gross focal neurologic deficits are appreciated.  Skin:  Skin is warm, dry and intact. No rash noted. Psychiatric: Mood and affect are normal. Speech and behavior are normal. Patient exhibits appropriate insight and judgement.   ____________________________________________   LABS (all labs ordered are listed, but only abnormal results are displayed)  Labs Reviewed - No data to display ____________________________________________  EKG   ____________________________________________  RADIOLOGY   No results found.  ____________________________________________    PROCEDURES  Procedure(s) performed:    Procedures    Medications  colchicine tablet 1.8 mg (has no administration in time range)     ____________________________________________   INITIAL IMPRESSION / ASSESSMENT AND PLAN / ED COURSE  Pertinent labs & imaging results that were available during my care of the patient were reviewed by me and considered in my medical decision making (see chart for details).  Review of the [place] CSRS was performed in accordance of the [facility] prior to dispensing any controlled drugs.     Patient's diagnosis is consistent with gout flare.  Patient presents emergency department for gout flare.  Patient is requesting refill of his  colchicine.  He has been taking this on a daily basis for his gout.  I am concerned at this time due to stage III kidney failure with daily intake of colchicine.  After lengthy discussion with patient, I will prescribe colchicine as he has been prescribed this in the past by his primary care provider.  Patient is advised to follow-up in 2 days with his new primary care discussed at length complications of colchicine and chronic kidney failure.  Further management of chronic gout will be managed by his primary care..  Patient is given ED precautions to return to the ED for any worsening or new symptoms.     ____________________________________________  FINAL CLINICAL IMPRESSION(S) / ED DIAGNOSES  Final diagnoses:  Acute gout due to renal impairment involving right hand  Medication refill  Stage 3 chronic kidney disease (HCC)      NEW MEDICATIONS STARTED DURING THIS VISIT:  ED Discharge Orders        Ordered    colchicine 0.6 MG tablet  Daily     07/15/17  2008          This chart was dictated using voice recognition software/Dragon. Despite best efforts to proofread, errors can occur which can change the meaning. Any change was purely unintentional.    Racheal Patches, PA-C 07/15/17 2017    Rockne Menghini, MD 07/16/17 [place] Manners

## 2017-07-15 NOTE — ED Notes (Signed)
Pt c/o gout flare up in his right hand that began 4 days ago. Pt reports he ran out of his medication a few months ago. Pt also reports he has an appointment with a new MD on Thursday but was told to come to the hospital if the pain worsened.

## 2017-07-15 NOTE — ED Triage Notes (Signed)
Pt reports is having a flare up of his gout in his right hand and wrist. Pt reports flare up for the past week or more. Pt states is out of his meds and is not scheduled with his new doc until Thursday so he needs a reful until then. Pt takes colchicine 0.6mg  1 tablet daily by mouth. Swelling noted to right wrist and hand.

## 2018-04-02 ENCOUNTER — Encounter: Payer: Self-pay | Admitting: Intensive Care

## 2018-04-02 ENCOUNTER — Other Ambulatory Visit: Payer: Self-pay

## 2018-04-02 ENCOUNTER — Emergency Department: Payer: Medicare Other

## 2018-04-02 ENCOUNTER — Inpatient Hospital Stay
Admission: EM | Admit: 2018-04-02 | Discharge: 2018-04-05 | DRG: 683 | Disposition: A | Discharge status: Home Health | Payer: Medicare Other | Attending: Internal Medicine | Admitting: Internal Medicine

## 2018-04-02 DIAGNOSIS — D631 Anemia in chronic kidney disease: Secondary | ICD-10-CM | POA: Diagnosis present

## 2018-04-02 DIAGNOSIS — N183 Chronic kidney disease, stage 3 (moderate): Secondary | ICD-10-CM | POA: Diagnosis present

## 2018-04-02 DIAGNOSIS — N179 Acute kidney failure, unspecified: Principal | ICD-10-CM | POA: Diagnosis present

## 2018-04-02 DIAGNOSIS — J209 Acute bronchitis, unspecified: Secondary | ICD-10-CM | POA: Diagnosis not present

## 2018-04-02 DIAGNOSIS — Z79899 Other long term (current) drug therapy: Secondary | ICD-10-CM | POA: Diagnosis not present

## 2018-04-02 DIAGNOSIS — M109 Gout, unspecified: Secondary | ICD-10-CM | POA: Diagnosis not present

## 2018-04-02 DIAGNOSIS — E860 Dehydration: Secondary | ICD-10-CM | POA: Diagnosis present

## 2018-04-02 DIAGNOSIS — R531 Weakness: Secondary | ICD-10-CM

## 2018-04-02 DIAGNOSIS — N390 Urinary tract infection, site not specified: Secondary | ICD-10-CM

## 2018-04-02 DIAGNOSIS — N19 Unspecified kidney failure: Secondary | ICD-10-CM | POA: Diagnosis present

## 2018-04-02 DIAGNOSIS — E871 Hypo-osmolality and hyponatremia: Secondary | ICD-10-CM | POA: Diagnosis present

## 2018-04-02 DIAGNOSIS — I129 Hypertensive chronic kidney disease with stage 1 through stage 4 chronic kidney disease, or unspecified chronic kidney disease: Secondary | ICD-10-CM | POA: Diagnosis present

## 2018-04-02 DIAGNOSIS — R52 Pain, unspecified: Secondary | ICD-10-CM

## 2018-04-02 LAB — CBC WITH DIFFERENTIAL/PLATELET
Abs Immature Granulocytes: 0.06 10*3/uL (ref 0.00–0.07)
Basophils Absolute: 0 10*3/uL (ref 0.0–0.1)
Basophils Relative: 0 %
Eosinophils Absolute: 0.1 10*3/uL (ref 0.0–0.5)
Eosinophils Relative: 1 %
HCT: 25.8 % — ABNORMAL LOW (ref 39.0–52.0)
Hemoglobin: 8.3 g/dL — ABNORMAL LOW (ref 13.0–17.0)
Immature Granulocytes: 1 %
Lymphocytes Relative: 9 %
Lymphs Abs: 1.2 10*3/uL (ref 0.7–4.0)
MCH: 29.7 pg (ref 26.0–34.0)
MCHC: 32.2 g/dL (ref 30.0–36.0)
MCV: 92.5 fL (ref 80.0–100.0)
Monocytes Absolute: 1.7 10*3/uL — ABNORMAL HIGH (ref 0.1–1.0)
Monocytes Relative: 13 %
Neutro Abs: 10.3 10*3/uL — ABNORMAL HIGH (ref 1.7–7.7)
Neutrophils Relative %: 76 %
Platelets: 328 10*3/uL (ref 150–400)
RBC: 2.79 MIL/uL — ABNORMAL LOW (ref 4.22–5.81)
RDW: 14.8 % (ref 11.5–15.5)
WBC: 13.3 10*3/uL — ABNORMAL HIGH (ref 4.0–10.5)
nRBC: 0 % (ref 0.0–0.2)

## 2018-04-02 LAB — URINALYSIS, COMPLETE (UACMP) WITH MICROSCOPIC
Bilirubin Urine: NEGATIVE
Glucose, UA: NEGATIVE mg/dL
Hgb urine dipstick: NEGATIVE
Ketones, ur: NEGATIVE mg/dL
Nitrite: NEGATIVE
Protein, ur: 30 mg/dL — AB
Specific Gravity, Urine: 1.017 (ref 1.005–1.030)
WBC, UA: >50 WBC/hpf — ABNORMAL HIGH (ref 0–5)
pH: 5 (ref 5.0–8.0)

## 2018-04-02 LAB — BASIC METABOLIC PANEL
Anion gap: 9 (ref 5–15)
BUN: 56 mg/dL — ABNORMAL HIGH (ref 8–23)
CO2: 25 mmol/L (ref 22–32)
Calcium: 8 mg/dL — ABNORMAL LOW (ref 8.9–10.3)
Chloride: 98 mmol/L (ref 98–111)
Creatinine, Ser: 2.66 mg/dL — ABNORMAL HIGH (ref 0.61–1.24)
GFR calc Af Amer: 24 mL/min — ABNORMAL LOW (ref >60)
GFR calc non Af Amer: 21 mL/min — ABNORMAL LOW (ref >60)
Glucose, Bld: 127 mg/dL — ABNORMAL HIGH (ref 70–99)
Potassium: 4.1 mmol/L (ref 3.5–5.1)
Sodium: 132 mmol/L — ABNORMAL LOW (ref 135–145)

## 2018-04-02 LAB — TROPONIN I: Troponin I: 0.05 ng/mL (ref <0.03)

## 2018-04-02 MED ORDER — HYDROCODONE-ACETAMINOPHEN 5-325 MG PO TABS: 1.0000 | ORAL_TABLET | ORAL | Status: DC | PRN

## 2018-04-02 MED ORDER — COLCHICINE 0.6 MG PO TABS
0.6000 mg | ORAL_TABLET | ORAL | Status: DC | PRN
  Filled 2018-04-02: qty 1

## 2018-04-02 MED ORDER — ACETAMINOPHEN 650 MG RE SUPP: 650.0000 mg | Freq: Four times a day (QID) | RECTAL | Status: DC | PRN

## 2018-04-02 MED ORDER — SODIUM CHLORIDE 0.9 % IV SOLN
1.0000 g | INTRAVENOUS | Status: DC
  Filled 2018-04-02: qty 10

## 2018-04-02 MED ORDER — SODIUM CHLORIDE 0.9 % IV BOLUS
1000.0000 mL | Freq: Once | INTRAVENOUS | Status: AC
  Administered 2018-04-02: 1000 mL via INTRAVENOUS

## 2018-04-02 MED ORDER — MORPHINE SULFATE (PF) 2 MG/ML IV SOLN: 2.0000 mg | INTRAVENOUS | Status: DC | PRN

## 2018-04-02 MED ORDER — SODIUM CHLORIDE 0.9 % IV SOLN
1.0000 g | Freq: Once | INTRAVENOUS | Status: AC
  Administered 2018-04-02: 1 g via INTRAVENOUS
  Filled 2018-04-02: qty 10

## 2018-04-02 MED ORDER — FENTANYL CITRATE (PF) 100 MCG/2ML IJ SOLN
50.0000 ug | Freq: Once | INTRAMUSCULAR | Status: AC
  Administered 2018-04-02: 50 ug via INTRAVENOUS
  Filled 2018-04-02: qty 2

## 2018-04-02 MED ORDER — METHYLPREDNISOLONE SODIUM SUCC 125 MG IJ SOLR
60.0000 mg | Freq: Once | INTRAMUSCULAR | Status: AC
  Administered 2018-04-02: 60 mg via INTRAVENOUS
  Filled 2018-04-02: qty 2

## 2018-04-02 MED ORDER — SENNOSIDES-DOCUSATE SODIUM 8.6-50 MG PO TABS
1.0000 | ORAL_TABLET | Freq: Every evening | ORAL | Status: DC | PRN
  Administered 2018-04-05: 1 via ORAL
  Filled 2018-04-02: qty 1

## 2018-04-02 MED ORDER — DILTIAZEM HCL ER COATED BEADS 300 MG PO CP24
300.0000 mg | ORAL_CAPSULE | Freq: Every day | ORAL | Status: DC
  Administered 2018-04-03 – 2018-04-05 (×3): 300 mg via ORAL
  Filled 2018-04-02 (×3): qty 1

## 2018-04-02 MED ORDER — CLONIDINE HCL 0.1 MG PO TABS
0.1000 mg | ORAL_TABLET | Freq: Two times a day (BID) | ORAL | Status: DC
  Administered 2018-04-02 – 2018-04-05 (×6): 0.1 mg via ORAL
  Filled 2018-04-02 (×6): qty 1

## 2018-04-02 MED ORDER — HEPARIN SODIUM (PORCINE) 5000 UNIT/ML IJ SOLN
5000.0000 [IU] | Freq: Three times a day (TID) | INTRAMUSCULAR | Status: DC
  Administered 2018-04-02 – 2018-04-05 (×7): 5000 [IU] via SUBCUTANEOUS
  Filled 2018-04-02 (×7): qty 1

## 2018-04-02 MED ORDER — ACETAMINOPHEN 325 MG PO TABS: 650.0000 mg | ORAL_TABLET | Freq: Four times a day (QID) | ORAL | Status: DC | PRN

## 2018-04-02 MED ORDER — ONDANSETRON HCL 4 MG PO TABS: 4.0000 mg | ORAL_TABLET | Freq: Four times a day (QID) | ORAL | Status: DC | PRN

## 2018-04-02 MED ORDER — ONDANSETRON HCL 4 MG/2ML IJ SOLN: 4.0000 mg | Freq: Four times a day (QID) | INTRAMUSCULAR | Status: DC | PRN

## 2018-04-02 MED ORDER — SODIUM CHLORIDE 0.9 % IV SOLN
INTRAVENOUS | Status: DC
  Administered 2018-04-02 – 2018-04-04 (×3): via INTRAVENOUS

## 2018-04-02 NOTE — ED Notes (Signed)
ED TO INPATIENT HANDOFF REPORT  Name/Age/Gender [facility] 85 y.o. male  Code Status    Code Status Orders  (From admission, onward)         Start     Ordered   04/02/18 1953  Full code  Continuous     04/02/18 1953        Code Status History    This patient has a current code status but no historical code status.    Advance Directive Documentation     Most Recent Value  Type of Advance Directive  Living will  Pre-existing out of facility DNR order (yellow form or pink MOST form)  -  "MOST" Form in Place?  -      Home/SNF/Other Home  Chief Complaint weakness and fever  Level of Care/Admitting Diagnosis ED Disposition    ED Disposition Condition Comment   Admit  Hospital Area: [facility] [[id number]]  Level of Care: Med-Surg [16]  Diagnosis: Renal failure [227997]  Admitting Physician: Ihor Austin [[id number]]  Attending Physician: Ihor Austin [[id number]]  Estimated length of stay: past midnight tomorrow  Certification:: I certify this patient will need inpatient services for at least 2 midnights  PT Class (Do Not Modify): Inpatient [101]  PT Acc Code (Do Not Modify): Private [1]       Medical History Past Medical History:  Diagnosis Date  . Arthritis   . Gout   . Hypertension     Allergies No Known Allergies  IV Location/Drains/Wounds Patient Lines/Drains/Airways Status   Active Line/Drains/Airways    Name:   Placement date:   Placement time:   Site:   Days:   Peripheral IV 04/02/18 Right Hand   04/02/18    1535    Hand   less than 1          Labs/Imaging Results for orders placed or performed during the hospital encounter of 04/02/18 (from the past 48 hour(s))  CBC with Differential     Status: Abnormal   Collection Time: 04/02/18  3:36 PM  Result Value Ref Range   WBC 13.3 (H) 4.0 - 10.5 K/uL   RBC 2.79 (L) 4.22 - 5.81 MIL/uL   Hemoglobin 8.3 (L) 13.0 - 17.0 g/dL   HCT 21.3 (L) 08.6 - 57.8 %   MCV 92.5 80.0 -  100.0 fL   MCH 29.7 26.0 - 34.0 pg   MCHC 32.2 30.0 - 36.0 g/dL   RDW 46.9 62.9 - 52.8 %   Platelets 328 150 - 400 K/uL   nRBC 0.0 0.0 - 0.2 %   Neutrophils Relative % 76 %   Neutro Abs 10.3 (H) 1.7 - 7.7 K/uL   Lymphocytes Relative 9 %   Lymphs Abs 1.2 0.7 - 4.0 K/uL   Monocytes Relative 13 %   Monocytes Absolute 1.7 (H) 0.1 - 1.0 K/uL   Eosinophils Relative 1 %   Eosinophils Absolute 0.1 0.0 - 0.5 K/uL   Basophils Relative 0 %   Basophils Absolute 0.0 0.0 - 0.1 K/uL   Immature Granulocytes 1 %   Abs Immature Granulocytes 0.06 0.00 - 0.07 K/uL    Comment: Performed at [facility], [address] [place], [place] [address]  Basic metabolic panel     Status: Abnormal   Collection Time: 04/02/18  3:36 PM  Result Value Ref Range   Sodium 132 (L) 135 - 145 mmol/L   Potassium 4.1 3.5 - 5.1 mmol/L   Chloride 98 98 - 111 mmol/L  CO2 25 22 - 32 mmol/L   Glucose, Bld 127 (H) 70 - 99 mg/dL   BUN 56 (H) 8 - 23 mg/dL   Creatinine, Ser 9.602.66 (H) 0.61 - 1.24 mg/dL   Calcium 8.0 (L) 8.9 - 10.3 mg/dL   GFR calc non Af Amer 21 (L) >60 mL/min   GFR calc Af Amer 24 (L) >60 mL/min   Anion gap 9 5 - 15    Comment: Performed at [facility], [address] [place], [place] [address]  Troponin I - ONCE - STAT     Status: Abnormal   Collection Time: 04/02/18  3:36 PM  Result Value Ref Range   Troponin I 0.05 (HH) <0.03 ng/mL    Comment: CRITICAL RESULT CALLED TO, READ BACK BY AND VERIFIED WITH AMBER PAYNE 04/02/18 @ 1700  MLK Performed at [facility], [address] [place], [place] [address]   Urinalysis, Complete w Microscopic     Status: Abnormal   Collection Time: 04/02/18  5:01 PM  Result Value Ref Range   Color, Urine AMBER (A) YELLOW    Comment: BIOCHEMICALS MAY BE AFFECTED BY COLOR   APPearance CLOUDY (A) CLEAR   Specific Gravity, Urine 1.017 1.005 - 1.030   pH 5.0 5.0 - 8.0   Glucose, UA NEGATIVE NEGATIVE mg/dL   Hgb urine dipstick NEGATIVE  NEGATIVE   Bilirubin Urine NEGATIVE NEGATIVE   Ketones, ur NEGATIVE NEGATIVE mg/dL   Protein, ur 30 (A) NEGATIVE mg/dL   Nitrite NEGATIVE NEGATIVE   Leukocytes,Ua LARGE (A) NEGATIVE   RBC / HPF 11-20 0 - 5 RBC/hpf   WBC, UA >50 (H) 0 - 5 WBC/hpf   Bacteria, UA RARE (A) NONE SEEN   Squamous Epithelial / LPF 0-5 0 - 5   Mucus PRESENT     Comment: Performed at [facility], [address] [place], [place] [address]   Dg Chest 2 View  Result Date: 04/02/2018 CLINICAL DATA:  Shortness of breath. EXAM: CHEST - 2 VIEW COMPARISON:  Radiographs of March 17, 2017. FINDINGS: The heart size and mediastinal contours are within normal limits. Both lungs are clear. No pneumothorax or pleural effusion is noted. The visualized skeletal structures are unremarkable. IMPRESSION: No active cardiopulmonary disease. Electronically Signed   By: Lupita RaiderJames  Green Jr, M.D.   On: 04/02/2018 17:01   Dg Shoulder Left  Result Date: 04/02/2018 CLINICAL DATA:  Left shoulder pain after fall today. EXAM: LEFT SHOULDER - 2+ VIEW COMPARISON:  None. FINDINGS: There is no evidence of fracture or dislocation. Moderate degenerative joint disease is noted at the left acromioclavicular joint. Mild degenerative changes seen involving the glenohumeral joint. Soft tissues are unremarkable. IMPRESSION: Degenerative joint disease is seen involving the left acromioclavicular and glenohumeral joints. No acute abnormality seen in the left shoulder. Electronically Signed   By: Lupita RaiderJames  Green Jr, M.D.   On: 04/02/2018 19:56    Pending Labs Unresulted Labs (From admission, onward)    Start     Ordered   04/03/18 0500  Basic metabolic panel  Tomorrow morning,   STAT     04/02/18 1953   04/03/18 0500  CBC  Tomorrow morning,   STAT     04/02/18 1953   04/02/18 1955  Troponin I - Now Then Q6H  Now then every 6 hours,   STAT     04/02/18 1954   04/02/18 1953  CBC  (heparin)  Once,   STAT    Comments:  Baseline for  heparin therapy IF  NOT ALREADY DRAWN.  Notify MD if PLT < 100 K.    04/02/18 1953   04/02/18 1953  Creatinine, serum  (heparin)  Once,   STAT    Comments:  Baseline for heparin therapy IF NOT ALREADY DRAWN.    04/02/18 1953   04/02/18 1826  Urine Culture  Add-on,   AD     04/02/18 1825          Vitals/Pain Today's Vitals   04/02/18 1930 04/02/18 2000 04/02/18 2015 04/02/18 2030  BP:      Pulse: 100 (!) 104 (!) 102 99  Resp: (!) 31 (!) 29 (!) 31 (!) 30  Temp:      TempSrc:      SpO2: 96% 94% 95% 97%  Weight:      Height:      PainSc:        Isolation Precautions No active isolations  Medications Medications  colchicine tablet 0.6 mg (has no administration in time range)  cloNIDine (CATAPRES) tablet 0.1 mg (has no administration in time range)  diltiazem (TIAZAC) 24 hr capsule 300 mg (has no administration in time range)  heparin injection 5,000 Units (has no administration in time range)  0.9 %  sodium chloride infusion (has no administration in time range)  acetaminophen (TYLENOL) tablet 650 mg (has no administration in time range)    Or  acetaminophen (TYLENOL) suppository 650 mg (has no administration in time range)  HYDROcodone-acetaminophen (NORCO/VICODIN) 5-325 MG per tablet 1-2 tablet (has no administration in time range)  ondansetron (ZOFRAN) tablet 4 mg (has no administration in time range)    Or  ondansetron (ZOFRAN) injection 4 mg (has no administration in time range)  senna-docusate (Senokot-S) tablet 1 tablet (has no administration in time range)  cefTRIAXone (ROCEPHIN) 1 g in sodium chloride 0.9 % 100 mL IVPB (has no administration in time range)  morphine 2 MG/ML injection 2 mg (has no administration in time range)  methylPREDNISolone sodium succinate (SOLU-MEDROL) 125 mg/2 mL injection 60 mg (has no administration in time range)  cefTRIAXone (ROCEPHIN) 1 g in sodium chloride 0.9 % 100 mL IVPB (0 g Intravenous Stopped 04/02/18 1929)  sodium chloride 0.9 % bolus 1,000 mL  (1,000 mLs Intravenous New Bag/Given 04/02/18 1852)  fentaNYL (SUBLIMAZE) injection 50 mcg (50 mcg Intravenous Given 04/02/18 2016)    Mobility walks

## 2018-04-02 NOTE — ED Triage Notes (Signed)
C/o L hand and L knee pain, generalized weakness. Temp 100.2, 98% RA 118/56b/p, 97HR, 18RR with EMS. NKDA

## 2018-04-02 NOTE — ED Notes (Signed)
Spoke with Marylene Land from lab to relay blood was sent down earlier for orders placed now

## 2018-04-02 NOTE — Progress Notes (Signed)
Advanced care plan.  Purpose of the Encounter: CODE STATUS  Parties in Attendance: Patient  Patient's Decision Capacity: Good  Subjective/Patient's story: Presented to emergency room for cough, pain in the ankle and wrist joints   Objective/Medical story Patient has acute kidney injury, dehydration Has flareup of gout   Goals of care determination:  Advance care directives goals of care and treatment plan discussed Patient wants everything done which includes CPR, intubation and ventilator if the need arises   CODE STATUS: Full code   Time spent discussing advanced care planning: 16 minutes

## 2018-04-02 NOTE — ED Provider Notes (Signed)
[facility] Emergency Department Provider Note  ____________________________________________   I have reviewed the triage vital signs and the nursing notes.   HISTORY  Chief Complaint Left knee, left hand pain  History limited by: Not Limited   HPI Richard Cervantes is a 85 y.o. male who presents to the emergency department today with primary concern for left knee and left hand pain.  Patient states that he has a history of gout.  He has been taking his gout medications without any significant relief.  Family also has concerns that the patient is getting sick.  They have noticed some shortness of breath and weakness.  The patient himself denies any significant shortness of breath but does feel weak.  He has not had any fevers.   Per medical record review patient has a history of gout, hypertension, arthritis.   Past Medical History:  Diagnosis Date  . Arthritis   . Gout   . Hypertension     There are no active problems to display for this patient.   History reviewed. No pertinent surgical history.  Prior to Admission medications   Medication Sig Start Date End Date Taking? Authorizing Provider  cloNIDine (CATAPRES) 0.1 MG tablet Take 1 tablet (0.1 mg total) by mouth 2 (two) times daily. 03/17/17 03/17/18  Triplett, Rulon Eisenmengerari B, FNP  colchicine 0.6 MG tablet Take 1 tablet (0.6 mg total) by mouth daily. Take 1 tab daily for at least 6 more days. If  Symptoms persist past 6 days continue to use until prescription is finished 07/15/17   Cuthriell, Delorise RoyalsJonathan D, PA-C  diltiazem (TIAZAC) 300 MG 24 hr capsule Take 300 mg by mouth daily.    [provider]  lisinopril-hydrochlorothiazide (PRINZIDE,ZESTORETIC) 20-25 MG tablet Take 1 tablet by mouth daily.    [provider]    Allergies Patient has no known allergies.  History reviewed. No pertinent family history.  Social History Social History   Tobacco Use  . Smoking status: Never Smoker  .  Smokeless tobacco: Never Used  Substance Use Topics  . Alcohol use: No    Frequency: Never  . Drug use: No    Review of Systems Constitutional: No fever/chills Eyes: No visual changes. ENT: No sore throat. Cardiovascular: Denies chest pain. Respiratory: Denies shortness of breath. Gastrointestinal: No abdominal pain.  No nausea, no vomiting.  No diarrhea.   Genitourinary: Negative for dysuria. Musculoskeletal: Positive for left hand and left knee pain. Skin: Negative for rash. Neurological: Negative for headaches, focal weakness or numbness.  ____________________________________________   PHYSICAL EXAM:  VITAL SIGNS: ED Triage Vitals  Enc Vitals Group     BP 04/02/18 1528 (!) 126/58     Pulse Rate 04/02/18 1528 96     Resp 04/02/18 1528 16     Temp 04/02/18 1528 98.4 F (36.9 C)     Temp Source 04/02/18 1528 Oral     SpO2 04/02/18 1528 95 %     Weight 04/02/18 1529 225 lb (102.1 kg)     Height 04/02/18 1529 5\' 10"  (1.778 m)     Head Circumference --      Peak Flow --      Pain Score 04/02/18 1529 10   Constitutional: Alert and oriented.  Eyes: Conjunctivae are normal.  ENT      Head: Normocephalic and atraumatic.      Nose: No congestion/rhinnorhea.      Mouth/Throat: Mucous membranes are moist.      Neck: No stridor. Hematological/Lymphatic/Immunilogical: No  cervical lymphadenopathy. Cardiovascular: Normal rate, regular rhythm.  No murmurs, rubs, or gallops.  Respiratory: Normal respiratory effort without tachypnea nor retractions. Breath sounds are clear and equal bilaterally. No wheezes/rales/rhonchi. Gastrointestinal: Soft and non tender. No rebound. No guarding.  Genitourinary: Deferred Musculoskeletal: Normal range of motion in all extremities. No lower extremity edema. Neurologic:  Normal speech and language. No gross focal neurologic deficits are appreciated.  Skin:  Skin is warm, dry and intact. No rash noted. Psychiatric: Mood and affect are normal.  Speech and behavior are normal. Patient exhibits appropriate insight and judgment.  ____________________________________________    LABS (pertinent positives/negatives)  CBC wbc 13.3, hgb 8.3, plt 328 UA cloudy, large leukocytes, >50 wbc Trop 0.05 BMP na 132, k 4.1, cr 2.66 ____________________________________________   EKG  I, Phineas Semen, attending physician, personally viewed and interpreted this EKG  EKG Time: 1529 Rate: 95 Rhythm: sinus rhythm Axis: normal Intervals: qtc 427 QRS: narrow ST changes: no st elevation Impression: normal ekg   ____________________________________________    RADIOLOGY  CXR No acute abnormality  ___________________________________________   PROCEDURES  Procedures  ____________________________________________   INITIAL IMPRESSION / ASSESSMENT AND PLAN / ED COURSE  Pertinent labs & imaging results that were available during my care of the patient were reviewed by me and considered in my medical decision making (see chart for details).   Patient presented to the emergency department today with complaints of both weakness and some joint pain.  Terms of the joint pain patient states he has a long history of gout.  He feels like throwing things gouty arthritis flare.  Terms of the weakness family states it is quite profound.  Differential would be broad including anemia, dehydration, electrolyte abnormality, infection amongst other etiologies.  Work-up is concerning for slightly elevated creatinine.  Patient also has evidence of urinary tract infection and mild leukocytosis in the blood work.  Blood was minimally elevated although at this point I doubt ACS being a cause of it.  Discussed findings of urinary tract infection with patient and plan on starting IV fluids and medication.  Initially family was also stating patient was not planing of some left shoulder pain after the fall.  X-ray without any concerning acute osseous  injuries.  ____________________________________________   FINAL CLINICAL IMPRESSION(S) / ED DIAGNOSES  Final diagnoses:  Lower urinary tract infection  Weakness     Note: This dictation was prepared with Dragon dictation. Any transcriptional errors that result from this process are unintentional     Phineas Semen, MD 04/03/18 1614

## 2018-04-02 NOTE — H&P (Signed)
[facility] - [place] [facility]   PATIENT NAME: Richard Cervantes    MR#:  [id number]  DATE OF BIRTH:  February 10, 1932  DATE OF ADMISSION:  04/02/2018  PRIMARY CARE PHYSICIAN: [employer]   REQUESTING/REFERRING PHYSICIAN:   CHIEF COMPLAINT:   Chief Complaint  Patient presents with  . Gout    HISTORY OF PRESENT ILLNESS: Richard Cervantes  is a 85 y.o. male with a known history of gout, hypertension, arthritis presented to the emergency room for cough as well as generalized weakness.  Patient also has some pain in the left wrist, left ankle.  Was evaluated in the emergency room his creatinine is elevated appears dry and dehydrated.  Urinalysis also showed infection.  Patient has some dysuria.  Cough is dry in nature.  Patient was worked up with chest x-ray which showed no pneumonia.  Was started on IV Rocephin antibiotic for urinary tract infection.  PAST MEDICAL HISTORY:   Past Medical History:  Diagnosis Date  . Arthritis   . Gout   . Hypertension     PAST SURGICAL HISTORY:  Past Surgical History:  Procedure Laterality Date  . none      SOCIAL HISTORY:  Social History   Tobacco Use  . Smoking status: Never Smoker  . Smokeless tobacco: Never Used  Substance Use Topics  . Alcohol use: No    Frequency: Never    FAMILY HISTORY: History reviewed. No pertinent family history.  DRUG ALLERGIES: No Known Allergies  REVIEW OF SYSTEMS:   CONSTITUTIONAL: No fever,has fatigue and weakness.  EYES: No blurred or double vision.  EARS, NOSE, AND THROAT: No tinnitus or ear pain.  RESPIRATORY: Has cough, no shortness of breath, wheezing , hemoptysis.  CARDIOVASCULAR: No chest pain, orthopnea, edema.  GASTROINTESTINAL: No nausea, vomiting, diarrhea or abdominal pain.  GENITOURINARY: No dysuria, hematuria.  ENDOCRINE: No polyuria, nocturia,  HEMATOLOGY: No anemia, easy bruising or bleeding SKIN: No rash or lesion. MUSCULOSKELETAL: pain  in the left wrist, and left ankle NEUROLOGIC: No tingling, numbness, weakness.  PSYCHIATRY: No anxiety or depression.   MEDICATIONS AT HOME:  Prior to Admission medications   Medication Sig Start Date End Date Taking? Authorizing Provider  cloNIDine (CATAPRES) 0.1 MG tablet Take 1 tablet (0.1 mg total) by mouth 2 (two) times daily. 03/17/17 04/02/18 Yes Triplett, Cari B, FNP  colchicine 0.6 MG tablet Take 1 tablet (0.6 mg total) by mouth daily. Take 1 tab daily for at least 6 more days. If  Symptoms persist past 6 days continue to use until prescription is finished 07/15/17  Yes Cuthriell, Delorise Royals, PA-C  diltiazem (TIAZAC) 300 MG 24 hr capsule Take 300 mg by mouth daily.   Yes [provider]  lisinopril-hydrochlorothiazide (PRINZIDE,ZESTORETIC) 20-25 MG tablet Take 1 tablet by mouth daily.   Yes [provider]      PHYSICAL EXAMINATION:   VITAL SIGNS: Blood pressure 111/78, pulse 92, temperature 98.4 F (36.9 C), temperature source Oral, resp. rate (!) 28, height 5\' 10"  (1.778 m), weight 102.1 kg, SpO2 97 %.  GENERAL:  85 y.o.-year-old patient lying in the bed with no acute distress.  EYES: Pupils equal, round, reactive to light and accommodation. No scleral icterus. Extraocular muscles intact.  HEENT: Head atraumatic, normocephalic. Oropharynx and nasopharynx clear.  NECK:  Supple, no jugular venous distention. No thyroid enlargement, no tenderness.  LUNGS: Normal breath sounds bilaterally, no wheezing, rales,rhonchi or crepitation. No use of accessory muscles of respiration.  CARDIOVASCULAR: S1,  S2 normal. No murmurs, rubs, or gallops.  ABDOMEN: Soft, nontender, nondistended. Bowel sounds present. No organomegaly or mass.  EXTREMITIES: No pedal edema, cyanosis, or clubbing. Tenderness noted in the left ankle and left wrist NEUROLOGIC: Cranial nerves II through XII are intact. Muscle strength 5/5 in all extremities. Sensation intact. Gait not checked.  PSYCHIATRIC:  The patient is alert and oriented x 3.  SKIN: No obvious rash, lesion, or ulcer.   LABORATORY PANEL:   CBC Recent Labs  Lab 04/02/18 1536  WBC 13.3*  HGB 8.3*  HCT 25.8*  PLT 328  MCV 92.5  MCH 29.7  MCHC 32.2  RDW 14.8  LYMPHSABS 1.2  MONOABS 1.7*  EOSABS 0.1  BASOSABS 0.0   ------------------------------------------------------------------------------------------------------------------  Chemistries  Recent Labs  Lab 04/02/18 1536  NA 132*  K 4.1  CL 98  CO2 25  GLUCOSE 127*  BUN 56*  CREATININE 2.66*  CALCIUM 8.0*   ------------------------------------------------------------------------------------------------------------------ estimated creatinine clearance is 24.7 mL/min (A) (by C-G formula based on SCr of 2.66 mg/dL (H)). ------------------------------------------------------------------------------------------------------------------ No results for input(s): TSH, T4TOTAL, T3FREE, THYROIDAB in the last 72 hours.  Invalid input(s): FREET3   Coagulation profile No results for input(s): INR, PROTIME in the last 168 hours. ------------------------------------------------------------------------------------------------------------------- No results for input(s): DDIMER in the last 72 hours. -------------------------------------------------------------------------------------------------------------------  Cardiac Enzymes Recent Labs  Lab 04/02/18 1536  TROPONINI 0.05*   ------------------------------------------------------------------------------------------------------------------ Invalid input(s): POCBNP  ---------------------------------------------------------------------------------------------------------------  Urinalysis    Component Value Date/Time   COLORURINE AMBER (A) 04/02/2018 1701   APPEARANCEUR CLOUDY (A) 04/02/2018 1701   APPEARANCEUR Clear 01/27/2014 1458   LABSPEC 1.017 04/02/2018 1701   LABSPEC 1.017 01/27/2014 1458    PHURINE 5.0 04/02/2018 1701   GLUCOSEU NEGATIVE 04/02/2018 1701   GLUCOSEU Negative 01/27/2014 1458   HGBUR NEGATIVE 04/02/2018 1701   BILIRUBINUR NEGATIVE 04/02/2018 1701   BILIRUBINUR Negative 01/27/2014 1458   KETONESUR NEGATIVE 04/02/2018 1701   PROTEINUR 30 (A) 04/02/2018 1701   NITRITE NEGATIVE 04/02/2018 1701   LEUKOCYTESUR LARGE (A) 04/02/2018 1701   LEUKOCYTESUR Trace 01/27/2014 1458     RADIOLOGY: Dg Chest 2 View  Result Date: 04/02/2018 CLINICAL DATA:  Shortness of breath. EXAM: CHEST - 2 VIEW COMPARISON:  Radiographs of March 17, 2017. FINDINGS: The heart size and mediastinal contours are within normal limits. Both lungs are clear. No pneumothorax or pleural effusion is noted. The visualized skeletal structures are unremarkable. IMPRESSION: No active cardiopulmonary disease. Electronically Signed   By: Lupita RaiderJames  Green Jr, M.D.   On: 04/02/2018 17:01   Dg Shoulder Left  Result Date: 04/02/2018 CLINICAL DATA:  Left shoulder pain after fall today. EXAM: LEFT SHOULDER - 2+ VIEW COMPARISON:  None. FINDINGS: There is no evidence of fracture or dislocation. Moderate degenerative joint disease is noted at the left acromioclavicular joint. Mild degenerative changes seen involving the glenohumeral joint. Soft tissues are unremarkable. IMPRESSION: Degenerative joint disease is seen involving the left acromioclavicular and glenohumeral joints. No acute abnormality seen in the left shoulder. Electronically Signed   By: Lupita RaiderJames  Green Jr, M.D.   On: 04/02/2018 19:56    EKG: Orders placed or performed during the hospital encounter of 04/02/18  . EKG 12-Lead  . EKG 12-Lead    IMPRESSION AND PLAN:   [age] year old male patient with a known history of gout, hypertension, arthritis presented to the emergency room for cough as well as generalized weakness.  Patient also complained of pain in the left shoulder, ankle and left wrist.  X-ray of the left shoulder  shows degenerative changes.  -Acute  kidney injury Probably secondary to dehydration IV fluid hydration Follow-up renal function  -Acute hyponatremia IV hydration with normal saline follow-up sodium level  -Acute UTI With Rocephin antibiotic 1 g daily and follow-up cultures  -Acute bronchitis Will be taken care of by above antibiotics  -Acute gouty attack IV Solu-Medrol 60 mg 1 dose Continue oral colchicine Pain management  -DVT prophylaxis With subcu heparin  -Elevated troponin Probably secondary to renal failure Cycle troponin rule out ischemia  All the records are reviewed and case discussed with ED provider. Management plans discussed with the patient, family and they are in agreement.  CODE STATUS:Full code    Code Status Orders  (From admission, onward)         Start     Ordered   04/02/18 1953  Full code  Continuous     04/02/18 1953        Code Status History    This patient has a current code status but no historical code status.    Advance Directive Documentation     Most Recent Value  Type of Advance Directive  Living will  Pre-existing out of facility DNR order (yellow form or pink MOST form)  -  "MOST" Form in Place?  -       TOTAL TIME TAKING CARE OF THIS PATIENT: 53 minutes.    Ihor AustinPavan Reggie Welge M.D on 04/02/2018 at 8:17 PM  Between 7am to 6pm - Pager - [phone]  After 6pm go to [email or web address] - password [email or web address]  [facility]  Office  [phone]  CC: Primary care physician; [facility] [employer]

## 2018-04-02 NOTE — ED Notes (Signed)
Patient transported to X-ray 

## 2018-04-03 DIAGNOSIS — N179 Acute kidney failure, unspecified: Secondary | ICD-10-CM | POA: Diagnosis not present

## 2018-04-03 LAB — BASIC METABOLIC PANEL
Anion gap: 10 (ref 5–15)
BUN: 55 mg/dL — ABNORMAL HIGH (ref 8–23)
CO2: 23 mmol/L (ref 22–32)
Calcium: 8 mg/dL — ABNORMAL LOW (ref 8.9–10.3)
Chloride: 100 mmol/L (ref 98–111)
Creatinine, Ser: 2.18 mg/dL — ABNORMAL HIGH (ref 0.61–1.24)
GFR calc Af Amer: 31 mL/min — ABNORMAL LOW (ref >60)
GFR calc non Af Amer: 27 mL/min — ABNORMAL LOW (ref >60)
Glucose, Bld: 183 mg/dL — ABNORMAL HIGH (ref 70–99)
Potassium: 4.5 mmol/L (ref 3.5–5.1)
Sodium: 133 mmol/L — ABNORMAL LOW (ref 135–145)

## 2018-04-03 LAB — TROPONIN I
Troponin I: 0.04 ng/mL (ref <0.03)
Troponin I: 0.03 ng/mL (ref <0.03)
Troponin I: 0.03 ng/mL (ref <0.03)

## 2018-04-03 LAB — CBC
HCT: 25.7 % — ABNORMAL LOW (ref 39.0–52.0)
Hemoglobin: 8.2 g/dL — ABNORMAL LOW (ref 13.0–17.0)
MCH: 30.3 pg (ref 26.0–34.0)
MCHC: 31.9 g/dL (ref 30.0–36.0)
MCV: 94.8 fL (ref 80.0–100.0)
Platelets: 297 10*3/uL (ref 150–400)
RBC: 2.71 MIL/uL — ABNORMAL LOW (ref 4.22–5.81)
RDW: 14.7 % (ref 11.5–15.5)
WBC: 9.7 10*3/uL (ref 4.0–10.5)
nRBC: 0 % (ref 0.0–0.2)

## 2018-04-03 MED ORDER — PREDNISONE 10 MG PO TABS: 10.0000 mg | ORAL_TABLET | Freq: Every day | ORAL | Status: DC

## 2018-04-03 MED ORDER — ADULT MULTIVITAMIN W/MINERALS CH
1.0000 | ORAL_TABLET | Freq: Every day | ORAL | Status: DC
  Administered 2018-04-04 – 2018-04-05 (×2): 1 via ORAL
  Filled 2018-04-03 (×2): qty 1

## 2018-04-03 MED ORDER — PREDNISONE 20 MG PO TABS: 20.0000 mg | ORAL_TABLET | Freq: Every day | ORAL | Status: DC

## 2018-04-03 MED ORDER — COLCHICINE 0.6 MG PO TABS
0.3000 mg | ORAL_TABLET | Freq: Every day | ORAL | Status: DC
  Administered 2018-04-03 – 2018-04-05 (×3): 0.3 mg via ORAL
  Filled 2018-04-03 (×3): qty 0.5
  Filled 2018-04-03 (×2): qty 1

## 2018-04-03 MED ORDER — PREDNISONE 20 MG PO TABS
40.0000 mg | ORAL_TABLET | Freq: Every day | ORAL | Status: AC
  Administered 2018-04-04: 40 mg via ORAL
  Filled 2018-04-03: qty 2

## 2018-04-03 MED ORDER — ENSURE ENLIVE PO LIQD
237.0000 mL | Freq: Two times a day (BID) | ORAL | Status: DC
  Administered 2018-04-03: 237 mL via ORAL

## 2018-04-03 MED ORDER — POLYVINYL ALCOHOL 1.4 % OP SOLN
1.0000 [drp] | OPHTHALMIC | Status: DC | PRN
  Administered 2018-04-03: 1 [drp] via OPHTHALMIC
  Filled 2018-04-03: qty 15

## 2018-04-03 MED ORDER — SODIUM CHLORIDE 0.9 % IV SOLN
1.0000 g | INTRAVENOUS | Status: DC
  Administered 2018-04-03 – 2018-04-04 (×2): 1 g via INTRAVENOUS
  Filled 2018-04-03: qty 10
  Filled 2018-04-03: qty 1
  Filled 2018-04-03: qty 10

## 2018-04-03 MED ORDER — PREDNISONE 20 MG PO TABS: 30.0000 mg | ORAL_TABLET | Freq: Every day | ORAL | Status: DC

## 2018-04-03 NOTE — Clinical Social Work Note (Signed)
CSW received phone call from patient's daughter Eunice Blase [phone], and patient's granddaughter Fredonia Highland [phone], they were inquiring what options are available after discharge from hospital.  CSW explained different options regarding going to short term rehab, ALF, long term care SNF or Home Health.  CSW explained that if patient does not go to short term rehab under insurance benefits, patient would have to private pay.  CSW explained that if patient goes to a facility and insurance does not pay patient usually will have to apply for Medicaid.  CSW will continue to follow patient's progress throughout discharge planning.  Ervin Knack. Telisha Zawadzki, MSW, Theresia Majors [phone]  04/03/2018 5:01 PM

## 2018-04-03 NOTE — Progress Notes (Signed)
Ch visited w/ pt and family to inform them that education will be provided in regards to AD. F/ u when other family members arrive.

## 2018-04-03 NOTE — Progress Notes (Addendum)
Initial Nutrition Assessment  DOCUMENTATION CODES:   Not applicable  INTERVENTION:   Ensure Enlive po BID, each supplement provides 350 kcal and 20 grams of protein  MVI daily   Liberalize diet   NUTRITION DIAGNOSIS:   Inadequate oral intake related to acute illness as evidenced by per patient/family report.  GOAL:   Patient will meet greater than or equal to 90% of their needs  MONITOR:   PO intake, Supplement acceptance, Labs, Weight trends, Skin  REASON FOR ASSESSMENT:   Malnutrition Screening Tool    ASSESSMENT:   85 year old male with past medical history of gout, hypertension, osteoarthritis, history of CKD stage III who presents to the hospital due to shoulder, wrist pain and suspected to be secondary to acute gout and also noted to be in acute on chronic renal failure.   Met with pt in room today. Pt reports good appetite and oral intake at baseline but reports his appetite had been decreased for several days. Pt reports his appetite is improving today; pt ate 65% of his lunch. Pt reports lactose intolerance; pt is unable to tolerate milk and ice cream. Pt reports diarrhea with gas after consuming milk. Pt is willing to try chocolate Ensure as this is lactose free and pt is not eating enough to meet his estimated needs. RD will send lactaid with meal trays. Per chart, pt appears to have lost 26lbs(12%) over the past year; this is not significant but worth noting. RD will liberalize pt's diet to encourage increased po intake.   Medications reviewed and include: heparin, prednisone, NaCl '@75ml'$ /hr, ceftriaxone  Labs reviewed: Na 133(L), BUN 55(H), creat 2.18(H) Hgb 8.2(L), Hct 25.7(L)  NUTRITION - FOCUSED PHYSICAL EXAM:    Most Recent Value  Orbital Region  No depletion  Upper Arm Region  No depletion  Thoracic and Lumbar Region  No depletion  Buccal Region  No depletion  Temple Region  No depletion  Clavicle Bone Region  No depletion  Clavicle and Acromion  Bone Region  No depletion  Scapular Bone Region  No depletion  Dorsal Hand  No depletion  Patellar Region  No depletion  Anterior Thigh Region  No depletion  Posterior Calf Region  No depletion  Edema (RD Assessment)  Mild  Hair  Reviewed  Eyes  Reviewed  Mouth  Reviewed  Skin  Reviewed  Nails  Reviewed     Diet Order:   Diet Order            Diet regular Room service appropriate? Yes; Fluid consistency: Thin  Diet effective now             EDUCATION NEEDS:   Education needs have been addressed  Skin:  Skin Assessment: Reviewed RN Assessment  Last BM:  2/13  Height:   Ht Readings from Last 1 Encounters:  04/02/18 '5\' 10"'$  (1.778 m)    Weight:   Wt Readings from Last 1 Encounters:  04/02/18 90.3 kg    Ideal Body Weight:  75.45 kg  BMI:  Body mass index is 28.57 kg/m.  Estimated Nutritional Needs:   Kcal:  1800-2100kcal/day   Protein:  90-100g/day   Fluid:  1.9L/day   Koleen Distance MS, RD, LDN Pager #- [phone] Office#- [phone] After Hours Pager: [phone]

## 2018-04-03 NOTE — Progress Notes (Signed)
Ch returned to complete AD w/ pt and family but daughter was not present at the time. Pt shared that he lost a wife and son a few months a part and does not want to go thru the life prolonging measures such as intubation or the feeding tube. Pt reflected on his life and how he would like to downsize and get everything in place before his health declines. Pt hopes to move in w/ daughter because the home he is in he wants to sell soon. Pt shared that the family is having a hard time accepting his health changes but is doing what he can to recover well. Ch prayed w/ pt and shared that f/u would be with another ch to complete the AD.

## 2018-04-03 NOTE — Evaluation (Signed)
Physical Therapy Evaluation Patient Details Name: Richard Cervantes MRN: [id number] DOB: [birth year] Today's Date: 04/03/2018   History of Present Illness  85 y.o. male with a known history of gout, hypertension, arthritis presented to the emergency room for cough as well as generalized weakness.  Patient also has some pain in the left wrist, left ankle. Pt did have a fall when he sat on dining table and it collapsed.  Clinical Impression  Pt did well with prolonged bout of ambulation but needed RW and had mild limp secondary to OA/gout stiffness/pain.  He was able to take a few steps w/o AD (and actually had improved posture with this)but he would not have been able to safely maintain prolonged ambulation without AD. Similarly he was able to manage steps, but was limited due to pain/stiffness.  He did well enough to be able to go home but will likely need AD until gout flare calms down, he reports that he will likely stay with sister initially to insure 24/7 assist initially.    Follow Up Recommendations Home health PT    Equipment Recommendations  None recommended by PT    Recommendations for Other Services       Precautions / Restrictions Precautions Precautions: Fall Restrictions Weight Bearing Restrictions: No      Mobility  Bed Mobility Overal bed mobility: Independent                Transfers Overall transfer level: Independent Equipment used: Rolling walker (2 wheeled)             General transfer comment: Pt with stooped posture, but no assist needing in getting to standing  Ambulation/Gait Ambulation/Gait assistance: Supervision Gait Distance (Feet): 200 Feet Assistive device: Rolling walker (2 wheeled)       General Gait Details: Pt was able to ambulate relatively well with walker, though he had stooped posture and some gneeral gout/OA pain with the effort.   Stairs Stairs: Yes Stairs assistance: Supervision Stair Management: One rail  Right Number of Stairs: 5 General stair comments: Pt needed to use step-to strategy secondary to knee/ankle stiffness, but able to negotiate w/o phyiscal assist  Wheelchair Mobility    Modified Rankin (Stroke Patients Only)       Balance Overall balance assessment: Modified Independent                                           Pertinent Vitals/Pain Pain Assessment: (R wrist, L knee, b/l ankle gout/OA pain)    Home Living Family/patient expects to be discharged to:: Private residence Living Arrangements: Children Available Help at Discharge: Available PRN/intermittently(son is at work t/o the day)   Home Access: Stairs to enter Entrance Stairs-Rails: Right Entrance Stairs-Number of Steps: 5   Home Equipment: Environmental consultant - 4 wheels;Walker - 2 wheels;Cane - single point      Prior Function Level of Independence: Independent         Comments: Pt has started using walker in the last week 2/2 gout flare, but generally does not need AD     Hand Dominance        Extremity/Trunk Assessment   Upper Extremity Assessment Upper Extremity Assessment: Overall WFL for tasks assessed;Generalized weakness(gout pain limited, but functional)    Lower Extremity Assessment Lower Extremity Assessment: Overall WFL for tasks assessed;Generalized weakness(general OA stiffness, R knee lacks TKA)  Communication   Communication: No difficulties  Cognition Arousal/Alertness: Awake/alert Behavior During Therapy: WFL for tasks assessed/performed Overall Cognitive Status: Within Functional Limits for tasks assessed                                        General Comments      Exercises     Assessment/Plan    PT Assessment Patient needs continued PT services  PT Problem List Decreased strength;Decreased range of motion;Decreased activity tolerance;Decreased balance;Decreased mobility;Decreased coordination;Decreased knowledge of use of  DME;Decreased safety awareness;Pain;Decreased knowledge of precautions       PT Treatment Interventions DME instruction;Gait training;Stair training;Functional mobility training;Therapeutic activities;Therapeutic exercise;Balance training;Neuromuscular re-education;Patient/family education    PT Goals (Current goals can be found in the Care Plan section)  Acute Rehab PT Goals Patient Stated Goal: get back to walking w/o AD PT Goal Formulation: With patient Time For Goal Achievement: 04/17/18 Potential to Achieve Goals: Good    Frequency Min 2X/week   Barriers to discharge Decreased caregiver support pt will likley stay with sister initially for 24/7 assist6    Co-evaluation               AM-PAC PT "6 Clicks" Mobility  Outcome Measure Help needed turning from your back to your side while in a flat bed without using bedrails?: None Help needed moving from lying on your back to sitting on the side of a flat bed without using bedrails?: None Help needed moving to and from a bed to a chair (including a wheelchair)?: None Help needed standing up from a chair using your arms (e.g., wheelchair or bedside chair)?: None Help needed to walk in hospital room?: A Little Help needed climbing 3-5 steps with a railing? : A Little 6 Click Score: 22    End of Session Equipment Utilized During Treatment: Gait belt Activity Tolerance: Patient tolerated treatment well Patient left: with call bell/phone within reach;in chair Nurse Communication: Mobility status(need for chair alarm box) PT Visit Diagnosis: Muscle weakness (generalized) (M62.81);Difficulty in walking, not elsewhere classified (R26.2);Pain Pain - Right/Left: Left Pain - part of body: Knee    Time: 8546-2703 PT Time Calculation (min) (ACUTE ONLY): 23 min   Charges:   PT Evaluation $PT Eval Low Complexity: 1 Low          Malachi Pro, DPT 04/03/2018, 5:23 PM

## 2018-04-03 NOTE — Progress Notes (Signed)
[employer] [facility]     PATIENT NAME: Richard Cervantes    MR#:  [id number]  DATE OF BIRTH:  Jun 15, 1931  SUBJECTIVE:   Patient presents to the hospital due to shoulder, wrist pain suspected be secondary to gout and also noted to be in acute on chronic renal failure.  Renal function improved with some IV fluids.  Pain in his shoulder and feet has improved still having some left wrist pain.  REVIEW OF SYSTEMS:    Review of Systems  Constitutional: Negative for chills and fever.  HENT: Negative for congestion and tinnitus.   Eyes: Negative for blurred vision and double vision.  Respiratory: Negative for cough, shortness of breath and wheezing.   Cardiovascular: Negative for chest pain, orthopnea and PND.  Gastrointestinal: Negative for abdominal pain, diarrhea, nausea and vomiting.  Genitourinary: Negative for dysuria and hematuria.  Neurological: Negative for dizziness, sensory change and focal weakness.  All other systems reviewed and are negative.   Nutrition: Heart Healthy Tolerating Diet: Yes Tolerating PT: Await Eval.      DRUG ALLERGIES:  No Known Allergies  VITALS:  Blood pressure (!) 115/51, pulse 89, temperature 99.1 F (37.3 C), temperature source Oral, resp. rate (!) 21, height 5\' 10"  (1.778 m), weight 90.3 kg, SpO2 98 %.  PHYSICAL EXAMINATION:   Physical Exam  GENERAL:  85 y.o.-year-old patient lying in bed in no acute distress.  EYES: Pupils equal, round, reactive to light and accommodation. No scleral icterus. Extraocular muscles intact.  HEENT: Head atraumatic, normocephalic. Oropharynx and nasopharynx clear.  NECK:  Supple, no jugular venous distention. No thyroid enlargement, no tenderness.  LUNGS: Normal breath sounds bilaterally, no wheezing, rales, rhonchi. No use of accessory muscles of respiration.  CARDIOVASCULAR: S1, S2 normal. No murmurs, rubs, or gallops.  ABDOMEN: Soft, nontender, nondistended. Bowel sounds  present. No organomegaly or mass.  EXTREMITIES: No cyanosis, clubbing or edema b/l.    NEUROLOGIC: Cranial nerves II through XII are intact. No focal Motor or sensory deficits b/l. Globally weak.   PSYCHIATRIC: The patient is alert and oriented x 3.  SKIN: No obvious rash, lesion, or ulcer.    LABORATORY PANEL:   CBC Recent Labs  Lab 04/03/18 0529  WBC 9.7  HGB 8.2*  HCT 25.7*  PLT 297   ------------------------------------------------------------------------------------------------------------------  Chemistries  Recent Labs  Lab 04/03/18 0529  NA 133*  K 4.5  CL 100  CO2 23  GLUCOSE 183*  BUN 55*  CREATININE 2.18*  CALCIUM 8.0*   ------------------------------------------------------------------------------------------------------------------  Cardiac Enzymes Recent Labs  Lab 04/03/18 1131  TROPONINI 0.03*   ------------------------------------------------------------------------------------------------------------------  RADIOLOGY:  Dg Chest 2 View  Result Date: 04/02/2018 CLINICAL DATA:  Shortness of breath. EXAM: CHEST - 2 VIEW COMPARISON:  Radiographs of March 17, 2017. FINDINGS: The heart size and mediastinal contours are within normal limits. Both lungs are clear. No pneumothorax or pleural effusion is noted. The visualized skeletal structures are unremarkable. IMPRESSION: No active cardiopulmonary disease. Electronically Signed   By: Lupita Raider, M.D.   On: 04/02/2018 17:01   Dg Shoulder Left  Result Date: 04/02/2018 CLINICAL DATA:  Left shoulder pain after fall today. EXAM: LEFT SHOULDER - 2+ VIEW COMPARISON:  None. FINDINGS: There is no evidence of fracture or dislocation. Moderate degenerative joint disease is noted at the left acromioclavicular joint. Mild degenerative changes seen involving the glenohumeral joint. Soft tissues are unremarkable. IMPRESSION: Degenerative joint disease is seen involving the left acromioclavicular and glenohumeral  joints.  No acute abnormality seen in the left shoulder. Electronically Signed   By: Lupita Raider, M.D.   On: 04/02/2018 19:56     ASSESSMENT AND PLAN:   85 year old male with past medical history of gout, hypertension, osteoarthritis, history of CKD stage III who presents to the hospital due to shoulder, wrist pain and suspected to be secondary to acute gout and also noted to be in acute on chronic renal failure.  1.  Acute on chronic renal failure-secondary to poor p.o. intake and dehydration. - Improving with IV fluids and will continue to monitor. -Renal dose meds, avoid nephrotoxins.  2.  Hyponatremia-also secondary to poor p.o. intake.  Improving with IV fluid hydration.  3.  UTI- continue IV ceftriaxone.  Follow urine cultures. -We will treat with 3 days, clinically patient is asymptomatic.  4.  Acute gout attack- patient still having some pain in his left wrist.  Pain in his shoulders and feet has improved. -Will place on prednisone taper, continue colchicine.  5.  Essential hypertension-continue clonidine  Await PT eval.     All the records are reviewed and case discussed with Care Management/Social Worker. Management plans discussed with the patient, family and they are in agreement.  CODE STATUS: Full code  DVT Prophylaxis: Hep. SQ  TOTAL TIME TAKING CARE OF THIS PATIENT: 30 minutes.   POSSIBLE D/C IN 1-2 DAYS, DEPENDING ON CLINICAL CONDITION.   Houston Siren M.D on 04/03/2018 at 1:51 PM  Between 7am to 6pm - Pager - [phone]  After 6pm go to [email or web address] - Therapist, nutritional Hospitalists  Office  [phone]  CC: Primary care physician; [facility], [employer]

## 2018-04-04 ENCOUNTER — Inpatient Hospital Stay: Payer: Medicare Other

## 2018-04-04 DIAGNOSIS — N179 Acute kidney failure, unspecified: Secondary | ICD-10-CM | POA: Diagnosis not present

## 2018-04-04 LAB — BASIC METABOLIC PANEL
Anion gap: 7 (ref 5–15)
BUN: 64 mg/dL — ABNORMAL HIGH (ref 8–23)
CO2: 23 mmol/L (ref 22–32)
Calcium: 7.8 mg/dL — ABNORMAL LOW (ref 8.9–10.3)
Chloride: 102 mmol/L (ref 98–111)
Creatinine, Ser: 1.83 mg/dL — ABNORMAL HIGH (ref 0.61–1.24)
GFR calc Af Amer: 38 mL/min — ABNORMAL LOW (ref >60)
GFR calc non Af Amer: 33 mL/min — ABNORMAL LOW (ref >60)
Glucose, Bld: 136 mg/dL — ABNORMAL HIGH (ref 70–99)
Potassium: 4.4 mmol/L (ref 3.5–5.1)
Sodium: 132 mmol/L — ABNORMAL LOW (ref 135–145)

## 2018-04-04 LAB — CK: Total CK: 47 U/L — ABNORMAL LOW (ref 49–397)

## 2018-04-04 LAB — URINE CULTURE: Culture: NO GROWTH

## 2018-04-04 LAB — URIC ACID: Uric Acid, Serum: 9.8 mg/dL — ABNORMAL HIGH (ref 3.7–8.6)

## 2018-04-04 MED ORDER — GUAIFENESIN-DM 100-10 MG/5ML PO SYRP
5.0000 mL | ORAL_SOLUTION | ORAL | Status: DC | PRN
  Administered 2018-04-04: 5 mL via ORAL
  Filled 2018-04-04 (×2): qty 5

## 2018-04-04 MED ORDER — HYDROCODONE-ACETAMINOPHEN 5-325 MG PO TABS: 1.0000 | ORAL_TABLET | ORAL | Status: DC | PRN

## 2018-04-04 MED ORDER — ALLOPURINOL 100 MG PO TABS
50.0000 mg | ORAL_TABLET | Freq: Every day | ORAL | Status: DC
  Administered 2018-04-05: 50 mg via ORAL
  Filled 2018-04-04: qty 0.5

## 2018-04-04 NOTE — Consult Note (Signed)
ORTHOPAEDIC CONSULTATION   REQUESTING PHYSICIAN: Alford Highland, MD  Chief Complaint: Left wrist pain  HPI: Richard Cervantes is a 85 y.o. male who complains of left wrist pain due to flareup of gout.  Patient was admitted yesterday for urinary tract infection and dehydration.  He has a history of recurrent gout attacks.  He takes 1 colchicine tablet a day.  On admission he was having some left shoulder wrist and ankle pain.  Started on prednisone.  A just has mild wrist pain.  His wrist x-rays showed scapholunate dissociation changes.  There is no history of recent trauma.  His most recent uric acid is 9.0.  Past Medical History:  Diagnosis Date  . Arthritis   . Gout   . Hypertension    Past Surgical History:  Procedure Laterality Date  . none     Social History   Socioeconomic History  . Marital status: Widowed    Spouse name: Not on file  . Number of children: Not on file  . Years of education: Not on file  . Highest education level: Not on file  Occupational History  . Not on file  Social Needs  . Financial resource strain: Not on file  . Food insecurity:    Worry: Not on file    Inability: Not on file  . Transportation needs:    Medical: Not on file    Non-medical: Not on file  Tobacco Use  . Smoking status: Never Smoker  . Smokeless tobacco: Never Used  Substance and Sexual Activity  . Alcohol use: No    Frequency: Never  . Drug use: No  . Sexual activity: Not on file  Lifestyle  . Physical activity:    Days per week: Not on file    Minutes per session: Not on file  . Stress: Not on file  Relationships  . Social connections:    Talks on phone: Not on file    Gets together: Not on file    Attends religious service: Not on file    Active member of club or organization: Not on file    Attends meetings of clubs or organizations: Not on file    Relationship status: Not on file  Other Topics Concern  . Not on file  Social History Narrative  . Not on  file   History reviewed. No pertinent family history. Allergies  Allergen Reactions  . Lactose Intolerance (Gi)    Prior to Admission medications   Medication Sig Start Date End Date Taking? Authorizing Provider  cloNIDine (CATAPRES) 0.1 MG tablet Take 1 tablet (0.1 mg total) by mouth 2 (two) times daily. 03/17/17 04/02/18 Yes Triplett, Cari B, FNP  colchicine 0.6 MG tablet Take 1 tablet (0.6 mg total) by mouth daily. Take 1 tab daily for at least 6 more days. If  Symptoms persist past 6 days continue to use until prescription is finished 07/15/17  Yes Cuthriell, Delorise Royals, PA-C  diltiazem (TIAZAC) 300 MG 24 hr capsule Take 300 mg by mouth daily.   Yes [provider]  lisinopril-hydrochlorothiazide (PRINZIDE,ZESTORETIC) 20-25 MG tablet Take 1 tablet by mouth daily.   Yes [provider]   Dg Wrist 2 Views Left  Result Date: 04/04/2018 CLINICAL DATA:  Patient reports dorsal left wrist pain for past week without injury. Patient reports hx of gout. Denies any previous injury to wrist. EXAM: LEFT WRIST - 2 VIEW COMPARISON:  None. FINDINGS: No acute fracture. There is significant widening of the scapholunate interval, measuring  6 mm. There is significant narrowing of the radial scaphoid joint with irregularity of the subchondral bone along the scaphoid facet of the distal radius and proximal articular aspect of the scaphoid. There is a triangular shaped piece of bone projecting posterior to the radiocarpal wrist joint on the lateral view. The origin of this bone fragment is unclear. There is surrounding soft tissue swelling. IMPRESSION: 1. Widened scapholunate interval consistent with scapholunate ligament disruption. 2. Arthropathic changes at the distal radius scaphoid articulation. 3. Small bone fragment along the dorsal aspect of the wrist joint. The source of this bone fragment is unclear. This is presumably chronic finding. Electronically Signed   By: Amie Portland M.D.   On:  04/04/2018 13:31   Dg Shoulder Left  Result Date: 04/02/2018 CLINICAL DATA:  Left shoulder pain after fall today. EXAM: LEFT SHOULDER - 2+ VIEW COMPARISON:  None. FINDINGS: There is no evidence of fracture or dislocation. Moderate degenerative joint disease is noted at the left acromioclavicular joint. Mild degenerative changes seen involving the glenohumeral joint. Soft tissues are unremarkable. IMPRESSION: Degenerative joint disease is seen involving the left acromioclavicular and glenohumeral joints. No acute abnormality seen in the left shoulder. Electronically Signed   By: Lupita Raider, M.D.   On: 04/02/2018 19:56    Positive ROS: All other systems have been reviewed and were otherwise negative with the exception of those mentioned in the HPI and as above.  Physical Exam: General: Alert, no acute distress Cardiovascular: No pedal edema Respiratory: No cyanosis, no use of accessory musculature GI: No organomegaly, abdomen is soft and non-tender Skin: No lesions in the area of chief complaint Neurologic: Sensation intact distally Psychiatric: Patient is competent for consent with normal mood and affect Lymphatic: No axillary or cervical lymphadenopathy  MUSCULOSKELETAL: Left wrist is not really very red or swollen.  It is mildly painful with range of motion.  Neurovascular status good distally.  Grip is good.  The shoulder is good motion without pain.  The left ankle has no swelling or tenderness and good motion.  Assessment: Acute mild gout flareup left wrist shoulder and ankle. Elevated uric acid level  Plan: Continue prednisone taper. Maintain colchicine 1-2 times per day while starting allopurinol for 3 months. We will order a brace for the wrist for short-term use. Scapholunate changes are old and chronic and are not relevant to his current hospitalization. Follow-up with PCP for long-term treatment of gout    Valinda Hoar, MD [phone]   04/04/2018 6:19 PM

## 2018-04-04 NOTE — Progress Notes (Signed)
Patient had an accident in bed, bowel movement was loose.  RN will monitor him.  He had a bowel movement this morning with the RN in the bathroom with him.  He flushed it so quickly, only saw brown, could not look at texture.  RN had him wash his hands with soap.  Christean Grief, RN

## 2018-04-04 NOTE — Plan of Care (Signed)
  Problem: Urinary Elimination: Goal: Signs and symptoms of infection will decrease Outcome: Progressing   

## 2018-04-04 NOTE — Progress Notes (Signed)
Patient ID: Richard Cervantes Buscemi, male   DOB: [birth year], 85 y.o.   MRN: [id number]  [employer] PROGRESS NOTE  Richard Cervantes Feider [id number] DOB: [birth year] DOA: 04/02/2018 PCP: [facility]  HPI/Subjective: Patient still complaining of left wrist pain going on for a while.  He states he has a history of gout.  His left knee is a little bit sore.  But better now.  Patient states he can even make a fist.  Objective: Vitals:   04/04/18 0502 04/04/18 0824  BP: 110/68 (!) 130/56  Pulse: 71 77  Resp: 18   Temp: 98.4 F (36.9 C) 98 F (36.7 C)  SpO2: 100% 98%    Filed Weights   04/02/18 1529 04/02/18 2328  Weight: 102.1 kg 90.3 kg    ROS: Review of Systems  Constitutional: Negative for chills and fever.  Eyes: Negative for blurred vision.  Respiratory: Negative for cough and shortness of breath.   Cardiovascular: Negative for chest pain.  Gastrointestinal: Negative for abdominal pain, constipation, diarrhea, nausea and vomiting.  Genitourinary: Negative for dysuria.  Musculoskeletal: Positive for joint pain.  Neurological: Negative for dizziness and headaches.   Exam: Physical Exam  Constitutional: He is oriented to person, place, and time.  HENT:  Nose: No mucosal edema.  Mouth/Throat: No oropharyngeal exudate or posterior oropharyngeal edema.  Eyes: Pupils are equal, round, and reactive to light. Conjunctivae, EOM and lids are normal.  Neck: No JVD present. Carotid bruit is not present. No edema present. No thyroid mass and no thyromegaly present.  Cardiovascular: S1 normal and S2 normal. Exam reveals no gallop.  No murmur heard. Pulses:      Dorsalis pedis pulses are 2+ on the right side and 2+ on the left side.  Respiratory: No respiratory distress. He has no wheezes. He has no rhonchi. He has no rales.  GI: Soft. Bowel sounds are normal. There is no abdominal tenderness.  Musculoskeletal:     Left wrist: He exhibits decreased range of motion,  tenderness and swelling.     Right ankle: He exhibits no swelling.     Left ankle: He exhibits no swelling.  Lymphadenopathy:    He has no cervical adenopathy.  Neurological: He is alert and oriented to person, place, and time. No cranial nerve deficit.  Skin: Skin is warm. No rash noted. Nails show no clubbing.  Psychiatric: He has a normal mood and affect.      Data Reviewed: Basic Metabolic Panel: Recent Labs  Lab 04/02/18 1536 04/03/18 0529 04/04/18 0405  NA 132* 133* 132*  K 4.1 4.5 4.4  CL 98 100 102  CO2 25 23 23   GLUCOSE 127* 183* 136*  BUN 56* 55* 64*  CREATININE 2.66* 2.18* 1.83*  CALCIUM 8.0* 8.0* 7.8*   CBC: Recent Labs  Lab 04/02/18 1536 04/03/18 0529  WBC 13.3* 9.7  NEUTROABS 10.3*  --   HGB 8.3* 8.2*  HCT 25.8* 25.7*  MCV 92.5 94.8  PLT 328 297   Cardiac Enzymes: Recent Labs  Lab 04/02/18 1536 04/02/18 2329 04/03/18 0529 04/03/18 1131 04/04/18 0405  CKTOTAL  --   --   --   --  47*  TROPONINI 0.05* 0.04* 0.03* 0.03*  --      Recent Results (from the past 240 hour(s))  Urine Culture     Status: None   Collection Time: 04/02/18  5:01 PM  Result Value Ref Range Status   Specimen Description   Final    URINE, RANDOM Performed at  [facility], [address] [place], [place] [address]    Special Requests   Final    NONE Performed at [facility], [address], [place], [place] [address]    Culture   Final    NO GROWTH Performed at [facility], [address] [place], [place] [address]    Report Status 04/04/2018 FINAL  Final     Studies: Dg Chest 2 View  Result Date: 04/02/2018 CLINICAL DATA:  Shortness of breath. EXAM: CHEST - 2 VIEW COMPARISON:  Radiographs of March 17, 2017. FINDINGS: The heart size and mediastinal contours are within normal limits. Both lungs are clear. No pneumothorax or pleural effusion is noted. The visualized skeletal structures are unremarkable. IMPRESSION: No active  cardiopulmonary disease. Electronically Signed   By: Lupita Raider, M.D.   On: 04/02/2018 17:01   Dg Wrist 2 Views Left  Result Date: 04/04/2018 CLINICAL DATA:  Patient reports dorsal left wrist pain for past week without injury. Patient reports hx of gout. Denies any previous injury to wrist. EXAM: LEFT WRIST - 2 VIEW COMPARISON:  None. FINDINGS: No acute fracture. There is significant widening of the scapholunate interval, measuring 6 mm. There is significant narrowing of the radial scaphoid joint with irregularity of the subchondral bone along the scaphoid facet of the distal radius and proximal articular aspect of the scaphoid. There is a triangular shaped piece of bone projecting posterior to the radiocarpal wrist joint on the lateral view. The origin of this bone fragment is unclear. There is surrounding soft tissue swelling. IMPRESSION: 1. Widened scapholunate interval consistent with scapholunate ligament disruption. 2. Arthropathic changes at the distal radius scaphoid articulation. 3. Small bone fragment along the dorsal aspect of the wrist joint. The source of this bone fragment is unclear. This is presumably chronic finding. Electronically Signed   By: Amie Portland M.D.   On: 04/04/2018 13:31   Dg Shoulder Left  Result Date: 04/02/2018 CLINICAL DATA:  Left shoulder pain after fall today. EXAM: LEFT SHOULDER - 2+ VIEW COMPARISON:  None. FINDINGS: There is no evidence of fracture or dislocation. Moderate degenerative joint disease is noted at the left acromioclavicular joint. Mild degenerative changes seen involving the glenohumeral joint. Soft tissues are unremarkable. IMPRESSION: Degenerative joint disease is seen involving the left acromioclavicular and glenohumeral joints. No acute abnormality seen in the left shoulder. Electronically Signed   By: Lupita Raider, M.D.   On: 04/02/2018 19:56    Scheduled Meds: . cloNIDine  0.1 mg Oral BID  . colchicine  0.3 mg Oral Daily  . diltiazem   300 mg Oral Daily  . feeding supplement (ENSURE ENLIVE)  237 mL Oral BID BM  . heparin  5,000 Units Subcutaneous Q8H  . multivitamin with minerals  1 tablet Oral Daily  . [START ON 04/05/2018] predniSONE  30 mg Oral Q breakfast   Followed by  . [START ON 04/06/2018] predniSONE  20 mg Oral Q breakfast   Followed by  . [START ON 04/07/2018] predniSONE  10 mg Oral Q breakfast   Continuous Infusions: . cefTRIAXone (ROCEPHIN)  IV Stopped (04/03/18 1826)    Assessment/Plan:   1. Acute kidney injury on chronic kidney disease stage III.  Patient's creatinine has improved from 2.66 down to 1.83.  We will hold off on IV fluids at this time since he is eating well. 2. Left wrist pain.  Patient has been treated for gout with prednisone.  Uric acid a little high.  Will likely need allopurinol will start low-dose tomorrow.  Left wrist x-ray showing a widened scapholunate interval consistent with scapholunate ligament disruption.  Will get orthopedic evaluation. 3. UTI suspected on presentation.  Urine culture shows no growth.  We will give today's dose of Rocephin and then stop. 4. Weakness and fall.  Physical therapy recommended home with home health.  Reviewed note from social work about family concerned about him at home alone. 5. Hypertension on clonidine  Code Status:     Code Status Orders  (From admission, onward)         Start     Ordered   04/02/18 1953  Full code  Continuous     04/02/18 1953        Code Status History    This patient has a current code status but no historical code status.    Advance Directive Documentation     Most Recent Value  Type of Advance Directive  Living will  Pre-existing out of facility DNR order (yellow form or pink MOST form)  -  "MOST" Form in Place?  -     Family Communication: Left a message for daughter Disposition Plan: To be determined  Consultants:  Orthopedics  Time spent: 28 minutes  Lundynn Cohoon [employer]

## 2018-04-05 DIAGNOSIS — N179 Acute kidney failure, unspecified: Secondary | ICD-10-CM | POA: Diagnosis not present

## 2018-04-05 LAB — CBC
HCT: 25.1 % — ABNORMAL LOW (ref 39.0–52.0)
Hemoglobin: 8.1 g/dL — ABNORMAL LOW (ref 13.0–17.0)
MCH: 30 pg (ref 26.0–34.0)
MCHC: 32.3 g/dL (ref 30.0–36.0)
MCV: 93 fL (ref 80.0–100.0)
Platelets: 349 10*3/uL (ref 150–400)
RBC: 2.7 MIL/uL — ABNORMAL LOW (ref 4.22–5.81)
RDW: 14 % (ref 11.5–15.5)
WBC: 12.3 10*3/uL — ABNORMAL HIGH (ref 4.0–10.5)
nRBC: 0 % (ref 0.0–0.2)

## 2018-04-05 LAB — IRON AND TIBC
Iron: 75 ug/dL (ref 45–182)
Saturation Ratios: 48 % — ABNORMAL HIGH (ref 17.9–39.5)
TIBC: 156 ug/dL — ABNORMAL LOW (ref 250–450)
UIBC: 81 ug/dL

## 2018-04-05 LAB — FERRITIN: Ferritin: 510 ng/mL — ABNORMAL HIGH (ref 24–336)

## 2018-04-05 LAB — BASIC METABOLIC PANEL
Anion gap: 7 (ref 5–15)
BUN: 58 mg/dL — ABNORMAL HIGH (ref 8–23)
CO2: 23 mmol/L (ref 22–32)
Calcium: 7.9 mg/dL — ABNORMAL LOW (ref 8.9–10.3)
Chloride: 102 mmol/L (ref 98–111)
Creatinine, Ser: 1.78 mg/dL — ABNORMAL HIGH (ref 0.61–1.24)
GFR calc Af Amer: 40 mL/min — ABNORMAL LOW (ref >60)
GFR calc non Af Amer: 34 mL/min — ABNORMAL LOW (ref >60)
Glucose, Bld: 131 mg/dL — ABNORMAL HIGH (ref 70–99)
Potassium: 4.6 mmol/L (ref 3.5–5.1)
Sodium: 132 mmol/L — ABNORMAL LOW (ref 135–145)

## 2018-04-05 MED ORDER — ENSURE ENLIVE PO LIQD: 237.0000 mL | Freq: Two times a day (BID) | ORAL | 0 refills | Status: AC

## 2018-04-05 MED ORDER — FLEET ENEMA 7-19 GM/118ML RE ENEM: 1.0000 | ENEMA | Freq: Every day | RECTAL | Status: DC | PRN

## 2018-04-05 MED ORDER — COLCHICINE 0.6 MG PO TABS: 0.3000 mg | ORAL_TABLET | Freq: Every day | ORAL | 0 refills | Status: AC

## 2018-04-05 MED ORDER — ALLOPURINOL 100 MG PO TABS: 50.0000 mg | ORAL_TABLET | Freq: Every day | ORAL | 0 refills | Status: AC

## 2018-04-05 MED ORDER — PREDNISONE 5 MG PO TABS: ORAL_TABLET | ORAL | 0 refills | Status: DC

## 2018-04-05 MED ORDER — BISACODYL 10 MG RE SUPP: 10.0000 mg | Freq: Once | RECTAL | Status: DC

## 2018-04-05 MED ORDER — POLYETHYLENE GLYCOL 3350 17 G PO PACK: 17.0000 g | PACK | Freq: Every day | ORAL | Status: DC

## 2018-04-05 MED ORDER — POLYETHYLENE GLYCOL 3350 17 G PO PACK: 17.0000 g | PACK | Freq: Every day | ORAL | Status: DC | PRN

## 2018-04-05 NOTE — Care Management Note (Signed)
Case Management Note  Patient Details  Name: Richard Cervantes MRN: [id number] Date of Birth: 1931/04/17  Subjective/Objective:  Patient to be discharged per MD order. Orders in place for home health services. CMS Medicare.gov Compare Post Acute Care list reviewed with patient and he has no preference of agency. Referral placed with Jermaine at [facility]. Patient will be leaving to his daughters home, he is unsure of her address, provided her direct number to [facility]. Patient has cane and rolling walker in home. Family to transport.                   Action/Plan:   Expected Discharge Date:  04/05/18               Expected Discharge Plan:  Home w Home Health Services  In-House Referral:     Discharge planning Services  CM Consult  Post Acute Care Choice:  Home Health Choice offered to:  Patient  DME Arranged:    DME Agency:     HH Arranged:  RN, PT, Nurse's Aide HH Agency:  [facility]  Status of Service:  Completed, signed off  If discussed at Long Length of Stay Meetings, dates discussed:    Additional Comments:  Virgel Manifold, RN 04/05/2018, 11:52 AM

## 2018-04-05 NOTE — Discharge Summary (Signed)
[employer] - [place] [facility]   PATIENT NAME: Richard Cervantes    MR#:  [id number]  DATE OF BIRTH:  1931/08/10  DATE OF ADMISSION:  04/02/2018 ADMITTING PHYSICIAN: Ihor Austin, MD  DATE OF DISCHARGE: 04/05/2018  PRIMARY CARE PHYSICIAN: [employer]    ADMISSION DIAGNOSIS:  weakness and fever  DISCHARGE DIAGNOSIS:  Active Problems:   Renal failure   SECONDARY DIAGNOSIS:   Past Medical History:  Diagnosis Date  . Arthritis   . Gout   . Hypertension     HOSPITAL COURSE:   1.  Acute kidney injury on chronic kidney disease stage III.  The patient's creatinine improved during the hospital course from 2.66 down to 1.78.  The patient was given IV fluids during the hospital course. 2.  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  Patient was given Solu-Medrol then prednisone.  Started low-dose allopurinol.  Continue low-dose colchicine.  We will give a longer prednisone taper.  Follow-up with PMD.  Uric acid elevated at 9.8. 3.  UTI suspected on presentation.  Urine culture shows no growth.  The patient received 3 days of Rocephin.  I will stop antibiotics at this time. 4.  Weakness and fall.  Physical therapy recommended home health. 5.  Hypertension on clonidine and Cardizem.  Hold on lisinopril HCT as it at this point time 6.  Left wrist x-ray showed widened scapholunate interval consistent with scapholunate ligament disruption.  Case discussed with orthopedics Dr. Hyacinth Meeker.  He ordered a wrist brace.  This is not likely the cause of his discomfort and poor range of motion in his wrist. 7.  Anemia of chronic kidney disease.  Hemoglobin 8.1.  Ferritin is high.  Continue to monitor as outpatient.  DISCHARGE CONDITIONS:   Satisfactory  CONSULTS OBTAINED:  Treatment Team:  Deeann Saint, MD  DRUG ALLERGIES:   Allergies  Allergen Reactions  . Lactose Intolerance (Gi)     DISCHARGE MEDICATIONS:   Allergies as of  04/05/2018      Reactions   Lactose Intolerance (gi)       Medication List    STOP taking these medications   lisinopril-hydrochlorothiazide 20-25 MG tablet Commonly known as:  PRINZIDE,ZESTORETIC     TAKE these medications   allopurinol 100 MG tablet Commonly known as:  ZYLOPRIM Take 0.5 tablets (50 mg total) by mouth daily.   cloNIDine 0.1 MG tablet Commonly known as:  CATAPRES Take 1 tablet (0.1 mg total) by mouth 2 (two) times daily.   colchicine 0.6 MG tablet Take 0.5 tablets (0.3 mg total) by mouth daily. What changed:    how much to take  additional instructions   diltiazem 300 MG 24 hr capsule Commonly known as:  TIAZAC Take 300 mg by mouth daily.   feeding supplement (ENSURE ENLIVE) Liqd Take 237 mLs by mouth 2 (two) times daily between meals.   predniSONE 5 MG tablet Commonly known as:  DELTASONE 5 tabs po day1; 4 tabs po day2,3; 3 tabs po day4,5; 2 tabs po day6,7; 1 tab po day8,9; 1/2 tab po day 10,11,12,13        DISCHARGE INSTRUCTIONS:   Follow-up PMD 5 days Follow-up Dr. Hyacinth Meeker orthopedic surgery in a few weeks  If you experience worsening of your admission symptoms, develop shortness of breath, life threatening emergency, suicidal or homicidal thoughts you must seek medical attention immediately by calling 911 or calling your MD immediately  if symptoms less severe.  You Must read  complete instructions/literature along with all the possible adverse reactions/side effects for all the Medicines you take and that have been prescribed to you. Take any new Medicines after you have completely understood and accept all the possible adverse reactions/side effects.   Please note  You were cared for by a hospitalist during your hospital stay. If you have any questions about your discharge medications or the care you received while you were in the hospital after you are discharged, you can call the unit and asked to speak with the hospitalist on call if the  hospitalist that took care of you is not available. Once you are discharged, your primary care physician will handle any further medical issues. Please note that NO REFILLS for any discharge medications will be authorized once you are discharged, as it is imperative that you return to your primary care physician (or establish a relationship with a primary care physician if you do not have one) for your aftercare needs so that they can reassess your need for medications and monitor your lab values.    Today   CHIEF COMPLAINT:   Chief Complaint  Patient presents with  . Gout    HISTORY OF PRESENT ILLNESS:  Richard Cervantes  is a 85 y.o. male with a known history of gout presented with left knee pain and left wrist pain   VITAL SIGNS:  Blood pressure (!) 144/62, pulse 83, temperature 97.7 F (36.5 C), temperature source Oral, resp. rate 16, height 5\' 10"  (1.778 m), weight 90.3 kg, SpO2 99 %.    PHYSICAL EXAMINATION:  GENERAL:  85 y.o.-year-old patient lying in the bed with no acute distress.  EYES: Pupils equal, round, reactive to light and accommodation. No scleral icterus. Extraocular muscles intact.  HEENT: Head atraumatic, normocephalic. Oropharynx and nasopharynx clear.  NECK:  Supple, no jugular venous distention. No thyroid enlargement, no tenderness.  LUNGS: Normal breath sounds bilaterally, no wheezing, rales,rhonchi or crepitation. No use of accessory muscles of respiration.  CARDIOVASCULAR: S1, S2 normal. No murmurs, rubs, or gallops.  ABDOMEN: Soft, non-tender, non-distended. Bowel sounds present. No organomegaly or mass.  EXTREMITIES: No pedal edema, cyanosis, or clubbing.  Patient with better range of motion left knee and also left wrist and hand.  Left hand still unable to make a total fist. NEUROLOGIC: Cranial nerves II through XII are intact. Muscle strength 5/5 in all extremities. Sensation intact. Gait not checked.  PSYCHIATRIC: The patient is alert and oriented x 3.   SKIN: No obvious rash, lesion, or ulcer.   DATA REVIEW:   CBC Recent Labs  Lab 04/05/18 0424  WBC 12.3*  HGB 8.1*  HCT 25.1*  PLT 349    Chemistries  Recent Labs  Lab 04/05/18 0424  NA 132*  K 4.6  CL 102  CO2 23  GLUCOSE 131*  BUN 58*  CREATININE 1.78*  CALCIUM 7.9*    Cardiac Enzymes Recent Labs  Lab 04/03/18 1131  TROPONINI 0.03*    Microbiology Results  Results for orders placed or performed during the hospital encounter of 04/02/18  Urine Culture     Status: None   Collection Time: 04/02/18  5:01 PM  Result Value Ref Range Status   Specimen Description   Final    URINE, RANDOM Performed at [facility] [address] [place], [place] [address]    Special Requests   Final    NONE Performed at [facility], [address], [place], [place] [address]    Culture  Final    NO GROWTH Performed at [facility], [address] [place], [place] [address]    Report Status 04/04/2018 FINAL  Final    RADIOLOGY:  Dg Wrist 2 Views Left  Result Date: 04/04/2018 CLINICAL DATA:  Patient reports dorsal left wrist pain for past week without injury. Patient reports hx of gout. Denies any previous injury to wrist. EXAM: LEFT WRIST - 2 VIEW COMPARISON:  None. FINDINGS: No acute fracture. There is significant widening of the scapholunate interval, measuring 6 mm. There is significant narrowing of the radial scaphoid joint with irregularity of the subchondral bone along the scaphoid facet of the distal radius and proximal articular aspect of the scaphoid. There is a triangular shaped piece of bone projecting posterior to the radiocarpal wrist joint on the lateral view. The origin of this bone fragment is unclear. There is surrounding soft tissue swelling. IMPRESSION: 1. Widened scapholunate interval consistent with scapholunate ligament disruption. 2. Arthropathic changes at the distal radius scaphoid articulation. 3. Small bone fragment along  the dorsal aspect of the wrist joint. The source of this bone fragment is unclear. This is presumably chronic finding. Electronically Signed   By: Amie Portlandavid  Ormond M.D.   On: 04/04/2018 13:31      Management plans discussed with the patient, family and they are in agreement.  CODE STATUS:     Code Status Orders  (From admission, onward)         Start     Ordered   04/02/18 1953  Full code  Continuous     04/02/18 1953        Code Status History    This patient has a current code status but no historical code status.    Advance Directive Documentation     Most Recent Value  Type of Advance Directive  Living will  Pre-existing out of facility DNR order (yellow form or pink MOST form)  -  "MOST" Form in Place?  -      TOTAL TIME TAKING CARE OF THIS PATIENT: 35 minutes.    Alford Highlandichard Yanira Tolsma M.D on 04/05/2018 at 2:15 PM  Between 7am to 6pm - Pager - [phone]  After 6pm go to [email or web address] - Social research officer, governmentpassword EPAS [email or web address]  [employer] [facility] Office  [phone]  CC: Primary care physician; [facility] [employer]

## 2018-04-05 NOTE — Progress Notes (Addendum)
Patient nicked himself shaving with a razor yesterday. It looks like it is healing well today.  It was very small.  Patient did not mention it.  Assistant notified RN and RN assessed it.  RN removed IV.  Patient will go home by private vehicle.  Patient requested a medicine to "move his bowels".  RN gave him the medicine, see MAR.  Patient wants to stay until he can go to the toilet because his abdomen hurts.  RN asked if it was nausea, he said no he wanted to move his bowels.  Note - patient had 2 bowel movements yesterday.  Christean Grief, RN

## 2018-04-10 ENCOUNTER — Telehealth: Payer: Self-pay

## 2018-04-10 NOTE — Telephone Encounter (Signed)
EMMI Follow-up: Mrs. Foust called as she said her father had received an automated call.  I explained our process of two automated calls post discharge to check to see how he was doing.  Said her dad was staying with her and her husband and we was doing good and getting stronger.  Said she got his Rx's filled and was in the process of making his follow-up appointments. I let her know there would be a second automated call with a different series of questions and to let us know if they had any concerns at that time.

## 2018-04-19 ENCOUNTER — Other Ambulatory Visit: Payer: Self-pay

## 2018-04-19 ENCOUNTER — Emergency Department
Admission: EM | Admit: 2018-04-19 | Discharge: 2018-04-19 | Disposition: A | Discharge status: Home / Self Care | Payer: Medicare HMO | Attending: Emergency Medicine | Admitting: Emergency Medicine

## 2018-04-19 ENCOUNTER — Emergency Department: Payer: Medicare HMO

## 2018-04-19 ENCOUNTER — Encounter: Payer: Self-pay | Admitting: Emergency Medicine

## 2018-04-19 DIAGNOSIS — I129 Hypertensive chronic kidney disease with stage 1 through stage 4 chronic kidney disease, or unspecified chronic kidney disease: Secondary | ICD-10-CM | POA: Diagnosis not present

## 2018-04-19 DIAGNOSIS — M25532 Pain in left wrist: Secondary | ICD-10-CM | POA: Diagnosis present

## 2018-04-19 DIAGNOSIS — M10332 Gout due to renal impairment, left wrist: Secondary | ICD-10-CM | POA: Diagnosis not present

## 2018-04-19 DIAGNOSIS — N189 Chronic kidney disease, unspecified: Secondary | ICD-10-CM | POA: Insufficient documentation

## 2018-04-19 DIAGNOSIS — N3000 Acute cystitis without hematuria: Secondary | ICD-10-CM

## 2018-04-19 LAB — CBC WITH DIFFERENTIAL/PLATELET
Abs Immature Granulocytes: 0.13 10*3/uL — ABNORMAL HIGH (ref 0.00–0.07)
Basophils Absolute: 0 10*3/uL (ref 0.0–0.1)
Basophils Relative: 0 %
Eosinophils Absolute: 0.4 10*3/uL (ref 0.0–0.5)
Eosinophils Relative: 2 %
HCT: 32 % — ABNORMAL LOW (ref 39.0–52.0)
Hemoglobin: 10.1 g/dL — ABNORMAL LOW (ref 13.0–17.0)
Immature Granulocytes: 1 %
Lymphocytes Relative: 6 %
Lymphs Abs: 1.1 10*3/uL (ref 0.7–4.0)
MCH: 30.5 pg (ref 26.0–34.0)
MCHC: 31.6 g/dL (ref 30.0–36.0)
MCV: 96.7 fL (ref 80.0–100.0)
Monocytes Absolute: 1.4 10*3/uL — ABNORMAL HIGH (ref 0.1–1.0)
Monocytes Relative: 8 %
Neutro Abs: 15.2 10*3/uL — ABNORMAL HIGH (ref 1.7–7.7)
Neutrophils Relative %: 83 %
Platelets: 336 10*3/uL (ref 150–400)
RBC: 3.31 MIL/uL — ABNORMAL LOW (ref 4.22–5.81)
RDW: 15.9 % — ABNORMAL HIGH (ref 11.5–15.5)
Smear Review: NORMAL
WBC: 18.2 10*3/uL — ABNORMAL HIGH (ref 4.0–10.5)
nRBC: 0 % (ref 0.0–0.2)

## 2018-04-19 LAB — URINALYSIS, COMPLETE (UACMP) WITH MICROSCOPIC
Bilirubin Urine: NEGATIVE
Glucose, UA: NEGATIVE mg/dL
Hgb urine dipstick: NEGATIVE
Ketones, ur: NEGATIVE mg/dL
Nitrite: NEGATIVE
Protein, ur: NEGATIVE mg/dL
Specific Gravity, Urine: 1.017 (ref 1.005–1.030)
WBC, UA: >50 WBC/hpf — ABNORMAL HIGH (ref 0–5)
pH: 6 (ref 5.0–8.0)

## 2018-04-19 LAB — COMPREHENSIVE METABOLIC PANEL
ALT: 14 U/L (ref 0–44)
AST: 17 U/L (ref 15–41)
Albumin: 3.2 g/dL — ABNORMAL LOW (ref 3.5–5.0)
Alkaline Phosphatase: 78 U/L (ref 38–126)
Anion gap: 10 (ref 5–15)
BUN: 30 mg/dL — ABNORMAL HIGH (ref 8–23)
CO2: 23 mmol/L (ref 22–32)
Calcium: 8.6 mg/dL — ABNORMAL LOW (ref 8.9–10.3)
Chloride: 101 mmol/L (ref 98–111)
Creatinine, Ser: 1.43 mg/dL — ABNORMAL HIGH (ref 0.61–1.24)
GFR calc Af Amer: 52 mL/min — ABNORMAL LOW (ref >60)
GFR calc non Af Amer: 45 mL/min — ABNORMAL LOW (ref >60)
Glucose, Bld: 155 mg/dL — ABNORMAL HIGH (ref 70–99)
Potassium: 4.4 mmol/L (ref 3.5–5.1)
Sodium: 134 mmol/L — ABNORMAL LOW (ref 135–145)
Total Bilirubin: 1.6 mg/dL — ABNORMAL HIGH (ref 0.3–1.2)
Total Protein: 7.4 g/dL (ref 6.5–8.1)

## 2018-04-19 LAB — URIC ACID: Uric Acid, Serum: 7.7 mg/dL (ref 3.7–8.6)

## 2018-04-19 MED ORDER — OXYCODONE HCL 5 MG PO TABS: 5.0000 mg | ORAL_TABLET | Freq: Four times a day (QID) | ORAL | 0 refills | Status: AC | PRN

## 2018-04-19 MED ORDER — PREDNISONE 10 MG PO TABS: 10.0000 mg | ORAL_TABLET | Freq: Every day | ORAL | 0 refills | Status: DC

## 2018-04-19 MED ORDER — PREDNISONE 20 MG PO TABS
60.0000 mg | ORAL_TABLET | Freq: Once | ORAL | Status: AC
  Administered 2018-04-19: 60 mg via ORAL
  Filled 2018-04-19: qty 3

## 2018-04-19 MED ORDER — ACETAMINOPHEN 500 MG PO TABS
1000.0000 mg | ORAL_TABLET | Freq: Once | ORAL | Status: AC
  Administered 2018-04-19: 1000 mg via ORAL
  Filled 2018-04-19: qty 2

## 2018-04-19 MED ORDER — SODIUM CHLORIDE 0.9 % IV BOLUS
1000.0000 mL | Freq: Once | INTRAVENOUS | Status: AC
  Administered 2018-04-19: 1000 mL via INTRAVENOUS

## 2018-04-19 MED ORDER — SULFAMETHOXAZOLE-TRIMETHOPRIM 800-160 MG PO TABS
1.0000 | ORAL_TABLET | Freq: Once | ORAL | Status: AC
  Administered 2018-04-19: 1 via ORAL
  Filled 2018-04-19: qty 1

## 2018-04-19 MED ORDER — OXYCODONE HCL 5 MG PO TABS
5.0000 mg | ORAL_TABLET | Freq: Once | ORAL | Status: AC
  Administered 2018-04-19: 5 mg via ORAL
  Filled 2018-04-19: qty 1

## 2018-04-19 MED ORDER — SULFAMETHOXAZOLE-TRIMETHOPRIM 800-160 MG PO TABS: 1.0000 | ORAL_TABLET | Freq: Two times a day (BID) | ORAL | 0 refills | Status: DC

## 2018-04-19 NOTE — Discharge Instructions (Signed)
For your urinary tract infection, please take the entire course of Bactrim, even if you do not have any symptoms.  Please have your primary care physician follow-up the results of your urinary culture.  For your gout, please take the entire course of steroids, even if you are feeling better.  You may keep your hand elevated and apply ice for 15 minutes every 2 hours if this helps with your symptoms.  Continue to drink plenty of fluid to improve your kidney function, which will also help your gout.  Return to the emergency department if you develop any warmth or redness over your wrist, if you develop fever, worsening pain in your wrist, nausea or vomiting, or any other symptoms concerning to you.

## 2018-04-19 NOTE — ED Provider Notes (Signed)
[facility] Emergency Department Provider Note  ____________________________________________  Time seen: Approximately 3:08 PM  I have reviewed the triage vital signs and the nursing notes.   HISTORY  Chief Complaint Hand Pain    HPI Richard Cervantes is a 85 y.o. male, right-handed, history of renal insufficiency and gout, with left hand swelling.  The patient was discharged 04/05/2018 when hospitalized for gouty inflammation of the left hand and knee for pain control; he was discharged on a 6-day course of prednisone taper.  After he stopped the steroids, he noted that his left hand and wrist had increased swelling and pain.  He has taken Tylenol without significant improvement.  He cannot take NSAIDs because of his kidney disease.  Not had any systemic illness including fevers or chills, nausea or vomiting.  Past Medical History:  Diagnosis Date  . Arthritis   . Gout   . Hypertension     Patient Active Problem List   Diagnosis Date Noted  . Renal failure 04/02/2018    Past Surgical History:  Procedure Laterality Date  . none      Current Outpatient Rx  . Order #: [id number] Class: Print  . Order #: [id number] Class: Print  . Order #: [id number] Class: Print  . Order #: [id number] Class: Historical Med  . Order #: [id number] Class: Print  . Order #: [id number] Class: Print  . Order #: [id number] Class: Sample  . Order #: [id number] Class: Print    Allergies Lactose intolerance (gi)  No family history on file.  Social History Social History   Tobacco Use  . Smoking status: Never Smoker  . Smokeless tobacco: Never Used  Substance Use Topics  . Alcohol use: No    Frequency: Never  . Drug use: No    Review of Systems Constitutional: No fever/chills.  No lightheadedness or syncope.  No trauma. Eyes: No visual changes. ENT: No sore throat. No congestion or rhinorrhea. Cardiovascular: Denies chest pain. Denies palpitations. Respiratory: Denies  shortness of breath.  No cough. Gastrointestinal: No abdominal pain.  No nausea, no vomiting.  No diarrhea.  No constipation. Genitourinary: Negative for dysuria.  Positive treatment for UTI. Musculoskeletal: Negative for back pain.  No neck pain.  Positive left wrist and proximal hand swelling and pain. Skin: Negative for rash. Neurological: Negative for headaches. No focal numbness, tingling or weakness.     ____________________________________________   PHYSICAL EXAM:  VITAL SIGNS: ED Triage Vitals  Enc Vitals Group     BP 04/19/18 1003 (!) 154/50     Pulse Rate 04/19/18 1003 (!) 111     Resp 04/19/18 1003 18     Temp 04/19/18 1003 98.2 F (36.8 C)     Temp Source 04/19/18 1003 Oral     SpO2 04/19/18 1003 98 %     Weight 04/19/18 1005 199 lb 1.6 oz (90.3 kg)     Height 04/19/18 1005  (1.778 m)     Head Circumference --      Peak Flow --      Pain Score 04/19/18 1005 7     Pain Loc --      Pain Edu? --      Excl. in GC? --     Constitutional: Alert and oriented. Answers questions appropriately.  Chronically ill-appearing. Eyes: Conjunctivae are normal.  EOMI. No scleral icterus. Head: Atraumatic. Nose: No congestion/rhinnorhea. Mouth/Throat: Mucous membranes are moist.  Neck: No stridor.  Supple.   Cardiovascular: Normal rate Respiratory: Normal respiratory effort.   Gastrointestinal: Soft, nontender and  nondistended.  No guarding or rebound.  No peritoneal signs. Musculoskeletal: Patient has swelling involving the left wrist, and proximal hand with minimal warmth, but no erythema.  He has normal left radial pulse.  Cap refill is less than 2 seconds in the left hand.  He has pain with range of motion of the left wrist but is able to move the wrist with moderate discomfort.  He does not have severe pain with movement of the wrist. Neurologic:  A&Ox3.  Speech is clear.  Face and smile are symmetric.  EOMI.  Moves all extremities well. Skin:  Skin is warm, dry and  intact. No rash noted. Psychiatric: Mood and affect are normal.   ____________________________________________   LABS (all labs ordered are listed, but only abnormal results are displayed)  Labs Reviewed  CBC WITH DIFFERENTIAL/PLATELET - Abnormal; Notable for the following components:      Result Value   WBC 18.2 (*)    RBC 3.31 (*)    Hemoglobin 10.1 (*)    HCT 32.0 (*)    RDW 15.9 (*)    Neutro Abs 15.2 (*)    Monocytes Absolute 1.4 (*)    Abs Immature Granulocytes 0.13 (*)    All other components within normal limits  COMPREHENSIVE METABOLIC PANEL - Abnormal; Notable for the following components:   Sodium 134 (*)    Glucose, Bld 155 (*)    BUN 30 (*)    Creatinine, Ser 1.43 (*)    Calcium 8.6 (*)    Albumin 3.2 (*)    Total Bilirubin 1.6 (*)    GFR calc non Af Amer 45 (*)    GFR calc Af Amer 52 (*)    All other components within normal limits  URINALYSIS, COMPLETE (UACMP) WITH MICROSCOPIC - Abnormal; Notable for the following components:   Color, Urine YELLOW (*)    APPearance CLOUDY (*)    Leukocytes,Ua LARGE (*)    WBC, UA >50 (*)    Bacteria, UA FEW (*)    All other components within normal limits  URINE CULTURE  URIC ACID   ____________________________________________  EKG  Not indicated ____________________________________________  RADIOLOGY  Dg Hand Complete Left  Result Date: 04/19/2018 CLINICAL DATA:  Hand pain, no known injury, initial encounter EXAM: LEFT HAND - COMPLETE 3+ VIEW COMPARISON:  04/04/2018 FINDINGS: Considerable widening of the scapholunate space is again identified. Degenerative changes of the radiocarpal joint are again noted and stable from that seen on the prior exam. No acute fracture is noted in well corticated bony density is noted posteriorly likely related to prior avulsion. Soft tissue swelling is noted about the metacarpal phalangeal joints. IMPRESSION: Chronic changes are noted similar to that seen on the prior exam. No acute  abnormality noted. Electronically Signed   By: Alcide Clever M.D.   On: 04/19/2018 11:56    ____________________________________________   PROCEDURES  Procedure(s) performed: None  Procedures  Critical Care performed: No ____________________________________________   INITIAL IMPRESSION / ASSESSMENT AND PLAN / ED COURSE  Pertinent labs & imaging results that were available during my care of the patient were reviewed by me and considered in my medical decision making (see chart for details).  85 y.o. male with a history of gout, presenting with left wrist swelling and pain.  Overall, the patient is mildly tachycardic upon arrival but also having discomfort.  He will get 1 L of fluid, and pain control, will recheck his vital signs.  The patient is afebrile today.  His  wrist is consistent with gout.  Septic arthritis is considered, but less likely given that the patient is nontoxic and that he is able to range his wrist, albeit with some discomfort.  The patient has an increase in his white blood cell count from 12-18 compared to 2/16; however, he has been on prednisone at home and this is likely driving his white blood cell count.  The patient's renal insufficiency is better than his baseline with a creatinine of 1.43.  The patient has an x-ray which does not show any bony decomposition or acute fractures.  I have treated the patient with Tylenol with some improvement but not a lot so I will give him an oxycodone.  He has also been given instructions for elevation and ice.  I have spoken with Dr. Martha Clan, the orthopedist on call.  We both agree given his prior history, and length of symptoms, that he likely has acute on chronic gout, which is likely exacerbated now that he has been off the steroids and continues to have renal insufficiency.  I will plan to put him on a 10-day steroid taper, and have him follow-up with his PMD.  Instructed him to continue his colchicine and allopurinol.  I have  called the [place] [facility] primary care physician's office to let them know he was here and that he will need follow-up within 2 to 3 days.  Follow-up with rheumatology can also be helpful.  In addition, the patient does continue to have a UTI.  I have reviewed his urine culture from his last hospitalization, which did not have any growth.  He completed 3 days of Rocephin there.  New urine culture has been sent and I will place him on a 7-day course of Bactrim.  Follow-up with his PMD for this as well.  Plan discharge.  ____________________________________________  FINAL CLINICAL IMPRESSION(S) / ED DIAGNOSES  Final diagnoses:  Acute gout due to renal impairment involving left wrist  Acute cystitis without hematuria         NEW MEDICATIONS STARTED DURING THIS VISIT:  New Prescriptions   OXYCODONE (ROXICODONE) 5 MG IMMEDIATE RELEASE TABLET    Take 1 tablet (5 mg total) by mouth every 6 (six) hours as needed for severe pain.   PREDNISONE (DELTASONE) 10 MG TABLET    Take 1 tablet (10 mg total) by mouth daily. Day 1 Take 6 tablets Day 2 take 6 tablets Day 3 take 5 tablets Day 4 take 5 tablets Day 5 take 4 tablets Day 6 take 4 tablets Day 7 take 3 tablets Day 8 take 3 tablets Day 9 take 2 tablets Day 10 take 2 tablets Day 11 take 1 tablet Day 12 take 1 tablet   SULFAMETHOXAZOLE-TRIMETHOPRIM (BACTRIM DS,SEPTRA DS) 800-160 MG TABLET    Take 1 tablet by mouth 2 (two) times daily.      Rockne Menghini, MD 04/19/18 [phone]

## 2018-04-19 NOTE — Consult Note (Signed)
ORTHOPAEDIC CONSULTATION  REQUESTING PHYSICIAN: Rockne Menghini, MD  Chief Complaint: Left wrist pain  HPI: Richard Cervantes is a 85 y.o. male with a history of kidney disease and gout presents to the ER with increasing left wrist pain.  Patient was recently treated for gout with prednisone in mid February and had improvement in his left wrist pain.  Dr. Deeann Saint from orthopedics had recommended a wrist brace for immobilization.  He was scapholunate joint but it was not felt that this was the etiology of his pain.  Patient states he has had several recent flares of gout including in his feet.  He denies any fever or chills.  Past Medical History:  Diagnosis Date  . Arthritis   . Gout   . Hypertension    Past Surgical History:  Procedure Laterality Date  . none     Social History   Socioeconomic History  . Marital status: Widowed    Spouse name: Not on file  . Number of children: Not on file  . Years of education: Not on file  . Highest education level: Not on file  Occupational History  . Not on file  Social Needs  . Financial resource strain: Not on file  . Food insecurity:    Worry: Not on file    Inability: Not on file  . Transportation needs:    Medical: Not on file    Non-medical: Not on file  Tobacco Use  . Smoking status: Never Smoker  . Smokeless tobacco: Never Used  Substance and Sexual Activity  . Alcohol use: No    Frequency: Never  . Drug use: No  . Sexual activity: Not on file  Lifestyle  . Physical activity:    Days per week: Not on file    Minutes per session: Not on file  . Stress: Not on file  Relationships  . Social connections:    Talks on phone: Not on file    Gets together: Not on file    Attends religious service: Not on file    Active member of club or organization: Not on file    Attends meetings of clubs or organizations: Not on file    Relationship status: Not on file  Other Topics Concern  . Not on file  Social  History Narrative  . Not on file   No family history on file. Allergies  Allergen Reactions  . Lactose Intolerance (Gi)    Prior to Admission medications   Medication Sig Start Date End Date Taking? Authorizing Provider  allopurinol (ZYLOPRIM) 100 MG tablet Take 0.5 tablets (50 mg total) by mouth daily. 04/05/18   Alford Highland, MD  cloNIDine (CATAPRES) 0.1 MG tablet Take 1 tablet (0.1 mg total) by mouth 2 (two) times daily. 03/17/17 04/02/18  Triplett, Rulon Eisenmenger B, FNP  colchicine 0.6 MG tablet Take 0.5 tablets (0.3 mg total) by mouth daily. 04/05/18   Alford Highland, MD  diltiazem (TIAZAC) 300 MG 24 hr capsule Take 300 mg by mouth daily.    [provider]  feeding supplement, ENSURE ENLIVE, (ENSURE ENLIVE) LIQD Take 237 mLs by mouth 2 (two) times daily between meals. 04/05/18   Alford Highland, MD  oxyCODONE (ROXICODONE) 5 MG immediate release tablet Take 1 tablet (5 mg total) by mouth every 6 (six) hours as needed for severe pain. 04/19/18 04/19/19  Rockne Menghini, MD  predniSONE (DELTASONE) 10 MG tablet Take 1 tablet (10 mg total) by mouth daily. Day 1 Take 6 tablets Day 2 take  6 tablets Day 3 take 5 tablets Day 4 take 5 tablets Day 5 take 4 tablets Day 6 take 4 tablets Day 7 take 3 tablets Day 8 take 3 tablets Day 9 take 2 tablets Day 10 take 2 tablets Day 11 take 1 tablet Day 12 take 1 tablet 04/19/18   Rockne Menghini, MD  sulfamethoxazole-trimethoprim (BACTRIM DS,SEPTRA DS) 800-160 MG tablet Take 1 tablet by mouth 2 (two) times daily. 04/19/18   Rockne Menghini, MD   Dg Hand Complete Left  Result Date: 04/19/2018 CLINICAL DATA:  Hand pain, no known injury, initial encounter EXAM: LEFT HAND - COMPLETE 3+ VIEW COMPARISON:  04/04/2018 FINDINGS: Considerable widening of the scapholunate space is again identified. Degenerative changes of the radiocarpal joint are again noted and stable from that seen on the prior exam. No acute fracture is noted in well corticated bony density  is noted posteriorly likely related to prior avulsion. Soft tissue swelling is noted about the metacarpal phalangeal joints. IMPRESSION: Chronic changes are noted similar to that seen on the prior exam. No acute abnormality noted. Electronically Signed   By: Alcide Clever M.D.   On: 04/19/2018 11:56    Positive ROS: All other systems have been reviewed and were otherwise negative with the exception of those mentioned in the HPI and as above.  Physical Exam: General: Alert, no acute distress   MUSCULOSKELETAL: Left wrist: Patient skin is intact.  He has mild left wrist swelling.  There is no deformity.  Patient has intact sensation to light touch in all 5 digits of the left hand and the hand is well-perfused.  Palpable radial pulse.  Patient has pain with flexion extension of the wrist and has pain in the wrist with flexion extension of the fingers.  His digital range of motion is limited due to pain.  There is no significant swelling in his fingers or over the dorsum of his hand.  Patient has moderate pain with passive range of motion of the wrist.  He can supinate approximately 30 degrees and pronate approximately 90 degrees.  Assessment: Left wrist pain and swelling likely due to flareup of underlying gout  Plan: I discussed his case with Dr. Sharma Covert in the ER.  I do not believe the patient has a clinical picture consistent with septic joint.  He has had a previous flareup of gout in the left wrist over the past 2 weeks which improved on steroids.  Dr. Sharma Covert will discharge the patient on an extended course of steroid medication.  Patient is showing evidence of UTI on his urinalysis and will also be treated with antibiotics.  He will follow-up with his primary care physician this week.  Given the patient's multiple flareups of gout he may benefit from being on allopurinol if he is a candidate for this medication per his PCP.  Patient's x-rays show scapholunate interval widening with radiocarpal  arthritis.  If the patient continues to have pain in the left wrist he can follow-up in our office for further evaluation and treatment of a SLAC wrist by our hand surgeon.    Juanell Fairly, MD    04/19/2018 3:40 PM

## 2018-04-19 NOTE — ED Triage Notes (Signed)
PT arrives with complaints of left hand pain and swelling that per pt has significantly increased over the last few days. Swelling assessed in triage. Pt recently admitted for gout.

## 2018-04-22 LAB — URINE CULTURE: Culture: >=100000 — AB

## 2018-04-23 ENCOUNTER — Ambulatory Visit: Payer: Medicare Other | Admitting: Podiatry

## 2018-04-23 ENCOUNTER — Ambulatory Visit (INDEPENDENT_AMBULATORY_CARE_PROVIDER_SITE_OTHER): Payer: Medicare Other | Admitting: Podiatry

## 2018-04-23 ENCOUNTER — Encounter: Payer: Self-pay | Admitting: Podiatry

## 2018-04-23 VITALS — BP 151/75 | HR 93

## 2018-04-23 DIAGNOSIS — M79674 Pain in right toe(s): Secondary | ICD-10-CM | POA: Diagnosis not present

## 2018-04-23 DIAGNOSIS — M79675 Pain in left toe(s): Secondary | ICD-10-CM

## 2018-04-23 DIAGNOSIS — B351 Tinea unguium: Secondary | ICD-10-CM | POA: Diagnosis not present

## 2018-04-23 NOTE — Progress Notes (Signed)
Called home phone number with no answer or voicemail. Called patient's daughter about a prescription that will be ready for the patient at [facility]. Called a prescription for cipro 250 mg q12H x 5 days. Discussed case with Dr. Jene Every, MD.    Paschal Dopp, PharmD, BCPS

## 2018-04-23 NOTE — Progress Notes (Signed)
Complaint:  Visit Type: Patient presents  to my office for  preventative foot care services. Complaint: Patient states" my nails have grown long and thick and become painful to walk and wear shoes" Patient has been diagnosed with kidney disease. The patient presents for preventative foot care services. No changes to ROS  Podiatric Exam: Vascular: dorsalis pedis are palpable bilateral. Posterior tibial pulses are not palpable  B/L   Capillary return is immediate. Temperature gradient is WNL. Skin turgor WNL  Sensorium: Normal Semmes Weinstein monofilament test. Normal tactile sensation bilaterally. Nail Exam: Pt has thick disfigured discolored nails with subungual debris noted bilateral entire nail hallux through fifth toenails Ulcer Exam: There is no evidence of ulcer or pre-ulcerative changes or infection. Orthopedic Exam: Muscle tone and strength are WNL. No limitations in general ROM. No crepitus or effusions noted. Foot type and digits show no abnormalities. Bony prominences are unremarkable. Skin: No Porokeratosis. No infection or ulcers  Diagnosis:  Onychomycosis, , Pain in right toe, pain in left toes  Treatment & Plan Procedures and Treatment: Consent by patient was obtained for treatment procedures.   Debridement of mycotic and hypertrophic toenails, 1 through 5 bilateral and clearing of subungual debris. No ulceration, no infection noted.  Return Visit-Office Procedure: Patient instructed to return to the office for a follow up visit 3 months for continued evaluation and treatment.    Helane Gunther DPM

## 2018-07-23 ENCOUNTER — Ambulatory Visit: Payer: Medicare HMO | Admitting: Podiatry

## 2018-10-30 ENCOUNTER — Encounter: Payer: Self-pay | Admitting: Emergency Medicine

## 2018-10-30 ENCOUNTER — Other Ambulatory Visit: Payer: Self-pay

## 2018-10-30 DIAGNOSIS — R1084 Generalized abdominal pain: Secondary | ICD-10-CM | POA: Insufficient documentation

## 2018-10-30 DIAGNOSIS — I10 Essential (primary) hypertension: Secondary | ICD-10-CM | POA: Insufficient documentation

## 2018-10-30 DIAGNOSIS — Z79899 Other long term (current) drug therapy: Secondary | ICD-10-CM | POA: Diagnosis not present

## 2018-10-30 DIAGNOSIS — R109 Unspecified abdominal pain: Secondary | ICD-10-CM | POA: Diagnosis present

## 2018-10-30 LAB — COMPREHENSIVE METABOLIC PANEL
ALT: 14 U/L (ref 0–44)
AST: 34 U/L (ref 15–41)
Albumin: 3.9 g/dL (ref 3.5–5.0)
Alkaline Phosphatase: 141 U/L — ABNORMAL HIGH (ref 38–126)
Anion gap: 11 (ref 5–15)
BUN: 22 mg/dL (ref 8–23)
CO2: 24 mmol/L (ref 22–32)
Calcium: 8.6 mg/dL — ABNORMAL LOW (ref 8.9–10.3)
Chloride: 100 mmol/L (ref 98–111)
Creatinine, Ser: 1.43 mg/dL — ABNORMAL HIGH (ref 0.61–1.24)
GFR calc Af Amer: 52 mL/min — ABNORMAL LOW (ref >60)
GFR calc non Af Amer: 45 mL/min — ABNORMAL LOW (ref >60)
Glucose, Bld: 162 mg/dL — ABNORMAL HIGH (ref 70–99)
Potassium: 3 mmol/L — ABNORMAL LOW (ref 3.5–5.1)
Sodium: 135 mmol/L (ref 135–145)
Total Bilirubin: 0.7 mg/dL (ref 0.3–1.2)
Total Protein: 8.4 g/dL — ABNORMAL HIGH (ref 6.5–8.1)

## 2018-10-30 LAB — CBC
HCT: 34.8 % — ABNORMAL LOW (ref 39.0–52.0)
Hemoglobin: 11.4 g/dL — ABNORMAL LOW (ref 13.0–17.0)
MCH: 29.2 pg (ref 26.0–34.0)
MCHC: 32.8 g/dL (ref 30.0–36.0)
MCV: 89 fL (ref 80.0–100.0)
Platelets: 405 10*3/uL — ABNORMAL HIGH (ref 150–400)
RBC: 3.91 MIL/uL — ABNORMAL LOW (ref 4.22–5.81)
RDW: 13.9 % (ref 11.5–15.5)
WBC: 7.9 10*3/uL (ref 4.0–10.5)
nRBC: 0 % (ref 0.0–0.2)

## 2018-10-30 LAB — LIPASE, BLOOD: Lipase: 16 U/L (ref 11–51)

## 2018-10-30 MED ORDER — SODIUM CHLORIDE 0.9% FLUSH: 3.0000 mL | Freq: Once | INTRAVENOUS | Status: DC

## 2018-10-30 NOTE — ED Triage Notes (Signed)
Pt reports developed abdominal pain about an hr prior to arrival point to epigastric area, "just hurts" Pt denies any N/V/D pt talks in complete sentences no respiratory distress noted.

## 2018-10-31 ENCOUNTER — Emergency Department
Admission: EM | Admit: 2018-10-31 | Discharge: 2018-10-31 | Disposition: A | Discharge status: Home / Self Care | Payer: Medicare HMO | Attending: Emergency Medicine | Admitting: Emergency Medicine

## 2018-10-31 ENCOUNTER — Emergency Department: Payer: Medicare HMO

## 2018-10-31 DIAGNOSIS — R1084 Generalized abdominal pain: Secondary | ICD-10-CM

## 2018-10-31 LAB — URINALYSIS, COMPLETE (UACMP) WITH MICROSCOPIC
Bacteria, UA: NONE SEEN
Bilirubin Urine: NEGATIVE
Glucose, UA: NEGATIVE mg/dL
Ketones, ur: NEGATIVE mg/dL
Leukocytes,Ua: NEGATIVE
Nitrite: NEGATIVE
Protein, ur: 30 mg/dL — AB
Specific Gravity, Urine: 1.029 (ref 1.005–1.030)
Squamous Epithelial / HPF: NONE SEEN (ref 0–5)
pH: 5 (ref 5.0–8.0)

## 2018-10-31 LAB — LACTIC ACID, PLASMA: Lactic Acid, Venous: 1.6 mmol/L (ref 0.5–1.9)

## 2018-10-31 MED ORDER — SODIUM CHLORIDE 0.9 % IV BOLUS
1000.0000 mL | Freq: Once | INTRAVENOUS | Status: AC
  Administered 2018-10-31: 1000 mL via INTRAVENOUS

## 2018-10-31 MED ORDER — LIDOCAINE VISCOUS HCL 2 % MT SOLN
15.0000 mL | Freq: Once | OROMUCOSAL | Status: AC
  Administered 2018-10-31: 15 mL via ORAL
  Filled 2018-10-31: qty 15

## 2018-10-31 MED ORDER — MORPHINE SULFATE (PF) 4 MG/ML IV SOLN
4.0000 mg | Freq: Once | INTRAVENOUS | Status: AC
  Administered 2018-10-31: 4 mg via INTRAVENOUS
  Filled 2018-10-31: qty 1

## 2018-10-31 MED ORDER — ALUM & MAG HYDROXIDE-SIMETH 200-200-20 MG/5ML PO SUSP
30.0000 mL | Freq: Once | ORAL | Status: AC
  Administered 2018-10-31: 30 mL via ORAL
  Filled 2018-10-31: qty 30

## 2018-10-31 MED ORDER — POTASSIUM CHLORIDE CRYS ER 20 MEQ PO TBCR
40.0000 meq | EXTENDED_RELEASE_TABLET | Freq: Once | ORAL | Status: AC
  Administered 2018-10-31: 40 meq via ORAL
  Filled 2018-10-31: qty 2

## 2018-10-31 MED ORDER — IOHEXOL 300 MG/ML  SOLN
100.0000 mL | Freq: Once | INTRAMUSCULAR | Status: AC | PRN
  Administered 2018-10-31: 04:00:00 100 mL via INTRAVENOUS

## 2018-10-31 NOTE — ED Notes (Signed)
Patient given a warm blanket. 

## 2018-10-31 NOTE — ED Notes (Signed)
This RN to bedside, pt states after bowel movement he is feeling better, HR noted to be improved after having BM. MD notified that patient is feeling better, states he will reassess. Will continue to monitor for further patient needs.

## 2018-10-31 NOTE — ED Notes (Signed)
Pt states he needs to urinate. Pt provided with urinal for sample collection.

## 2018-10-31 NOTE — ED Notes (Signed)
This RN and Chrissy, RN to bedside, introduced selves to patient and his daughter. Pt resting in bed, c/o returning abdominal pain at this time. Pt noted to continue to be ST on the monitor. Will continue to monitor for further patient needs.

## 2018-10-31 NOTE — ED Provider Notes (Signed)
Patient reevaluated, his heart rate has normalized, he reports he feels much better after having a bowel movement he would like to go home.  He agrees to return if his symptoms return, discussed with his family who agrees with discharge as well   Richard Drafts, MD 10/31/18 [phone]

## 2018-10-31 NOTE — ED Provider Notes (Signed)
[facility] Emergency Department Provider Note   ____________________________________________   I have reviewed the triage vital signs and the nursing notes.   HISTORY  Chief Complaint Abdominal Pain   History limited by: Not Limited   HPI Richard Cervantes is a 85 y.o. male who presents to the emergency department today because of concerns for abdominal pain.  Patient states that the pain started yesterday evening.  States he had eaten a hot dog for lunch a little prior to the pain starting.  Pain is located in his central abdomen.  Has been constant since it started.  Has been accompanied by some nausea and vomiting.  No fevers.  States for the past week he has had diarrhea.  Denies similar pain in the past.  Denies history of abdominal surgeries.  Records reviewed. Per medical record review patient has a history of HTN  Past Medical History:  Diagnosis Date  . Arthritis   . Gout   . Hypertension     Patient Active Problem List   Diagnosis Date Noted  . Renal failure 04/02/2018    Past Surgical History:  Procedure Laterality Date  . none      Prior to Admission medications   Medication Sig Start Date End Date Taking? Authorizing Provider  allopurinol (ZYLOPRIM) 100 MG tablet Take 0.5 tablets (50 mg total) by mouth daily. 04/05/18   Loletha Grayer, MD  cloNIDine (CATAPRES) 0.1 MG tablet Take 1 tablet (0.1 mg total) by mouth 2 (two) times daily. 03/17/17 04/02/18  Triplett, Johnette Abraham B, FNP  colchicine 0.6 MG tablet Take 0.5 tablets (0.3 mg total) by mouth daily. 04/05/18   Loletha Grayer, MD  diltiazem (TIAZAC) 300 MG 24 hr capsule Take 300 mg by mouth daily.    [provider]  feeding supplement, ENSURE ENLIVE, (ENSURE ENLIVE) LIQD Take 237 mLs by mouth 2 (two) times daily between meals. 04/05/18   Loletha Grayer, MD  oxyCODONE (ROXICODONE) 5 MG immediate release tablet Take 1 tablet (5 mg total) by mouth every 6 (six) hours as needed for  severe pain. 04/19/18 04/19/19  Eula Listen, MD  predniSONE (DELTASONE) 10 MG tablet Take 1 tablet (10 mg total) by mouth daily. Day 1 Take 6 tablets Day 2 take 6 tablets Day 3 take 5 tablets Day 4 take 5 tablets Day 5 take 4 tablets Day 6 take 4 tablets Day 7 take 3 tablets Day 8 take 3 tablets Day 9 take 2 tablets Day 10 take 2 tablets Day 11 take 1 tablet Day 12 take 1 tablet 04/19/18   Eula Listen, MD  sulfamethoxazole-trimethoprim (BACTRIM DS,SEPTRA DS) 800-160 MG tablet Take 1 tablet by mouth 2 (two) times daily. 04/19/18   Eula Listen, MD    Allergies Lactose intolerance (gi)  No family history on file.  Social History Social History   Tobacco Use  . Smoking status: Never Smoker  . Smokeless tobacco: Never Used  Substance Use Topics  . Alcohol use: No    Frequency: Never  . Drug use: No    Review of Systems Constitutional: No fever/chills Eyes: No visual changes. ENT: No sore throat. Cardiovascular: Denies chest pain. Respiratory: Denies shortness of breath. Gastrointestinal: Positive for abdominal pain, nausea, vomiting.  Genitourinary: Negative for dysuria. Musculoskeletal: Negative for back pain. Skin: Negative for rash. Neurological: Negative for headaches, focal weakness or numbness.  ____________________________________________   PHYSICAL EXAM:  VITAL SIGNS: ED Triage Vitals  Enc Vitals Group     BP 10/30/18 2313 (!) 199/79  Pulse Rate 10/30/18 2313 (!) 102     Resp 10/30/18 2313 (!) 22     Temp 10/30/18 2313 97.9 F (36.6 C)     Temp Source 10/30/18 2313 Oral     SpO2 10/30/18 2313 99 %     Weight 10/30/18 2319 200 lb (90.7 kg)     Height 10/30/18 2319 '5\' 9"'$  (1.753 m)     Head Circumference --      Peak Flow --      Pain Score 10/30/18 2319 10   Constitutional: Alert and oriented.  Eyes: Conjunctivae are normal.  ENT      Head: Normocephalic and atraumatic.      Nose: No congestion/rhinnorhea.      Mouth/Throat: Mucous  membranes are moist.      Neck: No stridor. Hematological/Lymphatic/Immunilogical: No cervical lymphadenopathy. Cardiovascular: Normal rate, regular rhythm.  No murmurs, rubs, or gallops.  Respiratory: Normal respiratory effort without tachypnea nor retractions. Breath sounds are clear and equal bilaterally. No wheezes/rales/rhonchi. Gastrointestinal: Soft and minimally tender in the mid abdomen. Large left sided inguinal hernia. Non tender.  Genitourinary: Deferred Musculoskeletal: Normal range of motion in all extremities. No lower extremity edema. Neurologic:  Normal speech and language. No gross focal neurologic deficits are appreciated.  Skin:  Skin is warm, dry and intact. No rash noted. Psychiatric: Mood and affect are normal. Speech and behavior are normal. Patient exhibits appropriate insight and judgment.  ____________________________________________    LABS (pertinent positives/negatives)  Lipase 16 CBC wbc 7.9, hgb 11.4, plt 405 CMP na 135, k 3.0, glu 162, cr 1.43, alk phos 141  ____________________________________________   EKG  I, Nance Pear, attending physician, personally viewed and interpreted this EKG  EKG Time: 2324 Rate: 101 Rhythm: sinus tachycardia 1st degree av block Axis: normal Intervals: qtc 420 QRS: narrow ST changes: no st elevation Impression: abnormal ekg  ____________________________________________    RADIOLOGY  CT abd/pel Gallstones without signs of cholecystitis. Large inguinal hernia with bowel. No obvious evidence of incarceration. Some free fluid in the hernia.   ____________________________________________   PROCEDURES  Procedures  ____________________________________________   INITIAL IMPRESSION / ASSESSMENT AND PLAN / ED COURSE  Pertinent labs & imaging results that were available during my care of the patient were reviewed by me and considered in my medical decision making (see chart for details).   Patient  presented to the emergency department today because of concerns for abdominal pain.  Is located primarily in the mid abdomen.  On exam he had some mild tenderness to this area without any rebound or guarding.  Additionally patient has a large left inguinal hernia.  States the swelling has been there for years.  He denies any pain to that area and the hernia is nontender.  CT abdomen pelvis was obtained which showed gallstones without obvious cholecystitis.  Did show some free fluid in that hernia however no obvious evidence of incarceration.  I discussed with Dr. Adora Fridge over the phone he stated that free fluid in the hernia can occur if hernias chronic which per patient's report it appears to be.  After CT was obtained the patient's heart rate did jump up into the 120s.  At this time there is somewhat unclear etiology.  Will add on a lactic given pain and concern for possible ischemia.  Additionally discussed with patient his medications.  He states he is on some medications for high blood pressure but cannot remember the names.  Will see if pharmacy tech can evaluate to  see if elevated heart rate could be due to missed beta-blocker or calcium channel blocker. Patient will be given IV fluids.   ____________________________________________   FINAL CLINICAL IMPRESSION(S) / ED DIAGNOSES  Abdominal pain  Note: This dictation was prepared with Dragon dictation. Any transcriptional errors that result from this process are unintentional     Nance Pear, MD 10/31/18 [phone]

## 2018-10-31 NOTE — ED Notes (Signed)
Pt assisted to bedside toilet at this time with heart monitor on.

## 2018-10-31 NOTE — ED Notes (Signed)
Patient transported to CT 

## 2018-10-31 NOTE — ED Notes (Signed)
Pt c/o abdominal pain, sharp, 10/10 worse with palpation in all four quads, pt denies hx of abdominal surg (per pt only gout and HTN hx); 1 episode diarrhea this am, and vomited when arrived to room, pt reports last meal at afternoon "some watermelon" and pt took Pepto bismol  Pt very sensitive to pain on light palpation

## 2018-12-07 ENCOUNTER — Emergency Department
Admission: EM | Admit: 2018-12-07 | Discharge: 2018-12-07 | Disposition: A | Discharge status: Home / Self Care | Payer: Medicare HMO | Attending: Emergency Medicine | Admitting: Emergency Medicine

## 2018-12-07 ENCOUNTER — Other Ambulatory Visit: Payer: Self-pay

## 2018-12-07 DIAGNOSIS — R109 Unspecified abdominal pain: Secondary | ICD-10-CM | POA: Diagnosis not present

## 2018-12-07 DIAGNOSIS — Z5321 Procedure and treatment not carried out due to patient leaving prior to being seen by health care provider: Secondary | ICD-10-CM | POA: Insufficient documentation

## 2018-12-07 LAB — CBC
HCT: 36.1 % — ABNORMAL LOW (ref 39.0–52.0)
Hemoglobin: 11.4 g/dL — ABNORMAL LOW (ref 13.0–17.0)
MCH: 29 pg (ref 26.0–34.0)
MCHC: 31.6 g/dL (ref 30.0–36.0)
MCV: 91.9 fL (ref 80.0–100.0)
Platelets: 383 10*3/uL (ref 150–400)
RBC: 3.93 MIL/uL — ABNORMAL LOW (ref 4.22–5.81)
RDW: 14.1 % (ref 11.5–15.5)
WBC: 11.1 10*3/uL — ABNORMAL HIGH (ref 4.0–10.5)
nRBC: 0 % (ref 0.0–0.2)

## 2018-12-07 LAB — COMPREHENSIVE METABOLIC PANEL
ALT: 13 U/L (ref 0–44)
AST: 18 U/L (ref 15–41)
Albumin: 3.8 g/dL (ref 3.5–5.0)
Alkaline Phosphatase: 141 U/L — ABNORMAL HIGH (ref 38–126)
Anion gap: 9 (ref 5–15)
BUN: 23 mg/dL (ref 8–23)
CO2: 26 mmol/L (ref 22–32)
Calcium: 9.3 mg/dL (ref 8.9–10.3)
Chloride: 101 mmol/L (ref 98–111)
Creatinine, Ser: 1.36 mg/dL — ABNORMAL HIGH (ref 0.61–1.24)
GFR calc Af Amer: 55 mL/min — ABNORMAL LOW (ref >60)
GFR calc non Af Amer: 47 mL/min — ABNORMAL LOW (ref >60)
Glucose, Bld: 149 mg/dL — ABNORMAL HIGH (ref 70–99)
Potassium: 4.2 mmol/L (ref 3.5–5.1)
Sodium: 136 mmol/L (ref 135–145)
Total Bilirubin: 0.9 mg/dL (ref 0.3–1.2)
Total Protein: 8.7 g/dL — ABNORMAL HIGH (ref 6.5–8.1)

## 2018-12-07 LAB — LIPASE, BLOOD: Lipase: 12 U/L (ref 11–51)

## 2018-12-07 NOTE — ED Triage Notes (Signed)
Pt in with co mid abd pain that started today at 1430 states hx of the same but unsure of diagnosis. Pt denies any n.v.d or dysuria.

## 2018-12-08 MED ORDER — COLCHICINE 0.6 MG PO TABS
0.60 | ORAL_TABLET | ORAL | Status: DC
Start: 2018-12-08 — End: 2018-12-08

## 2018-12-08 MED ORDER — NAPHAZOLINE HCL OP
2.00 | OPHTHALMIC | Status: DC
Start: ? — End: 2018-12-08

## 2018-12-08 MED ORDER — AMLODIPINE BESYLATE 10 MG PO TABS
10.00 | ORAL_TABLET | ORAL | Status: DC
Start: 2018-12-09 — End: 2018-12-08

## 2018-12-08 MED ORDER — ALLOPURINOL 100 MG PO TABS
50.00 | ORAL_TABLET | ORAL | Status: DC
Start: 2018-12-09 — End: 2018-12-08

## 2018-12-08 MED ORDER — CARVEDILOL 6.25 MG PO TABS
6.25 | ORAL_TABLET | ORAL | Status: DC
Start: 2018-12-08 — End: 2018-12-08

## 2019-01-22 ENCOUNTER — Ambulatory Visit
Admission: RE | Admit: 2019-01-22 | Discharge: 2019-01-22 | Disposition: A | Discharge status: Home / Self Care | Payer: Medicare HMO | Source: Ambulatory Visit | Attending: Physician Assistant | Admitting: Physician Assistant

## 2019-01-22 ENCOUNTER — Other Ambulatory Visit: Payer: Self-pay

## 2019-01-22 ENCOUNTER — Other Ambulatory Visit: Payer: Self-pay | Admitting: Physician Assistant

## 2019-01-22 DIAGNOSIS — R05 Cough: Secondary | ICD-10-CM | POA: Diagnosis not present

## 2019-01-22 DIAGNOSIS — R059 Cough, unspecified: Secondary | ICD-10-CM

## 2019-02-25 ENCOUNTER — Emergency Department
Admission: EM | Admit: 2019-02-25 | Discharge: 2019-02-25 | Discharge status: Absent without leave | Payer: Medicare Other

## 2019-02-25 MED ORDER — ONDANSETRON HCL 4 MG/2ML IJ SOLN
4.00 | INTRAMUSCULAR | Status: DC
Start: ? — End: 2019-02-25

## 2019-02-25 MED ORDER — OXYCODONE-ACETAMINOPHEN 5-325 MG PO TABS
1.00 | ORAL_TABLET | ORAL | Status: DC
Start: ? — End: 2019-02-25

## 2019-02-25 MED ORDER — HEPARIN SODIUM (PORCINE) 5000 UNIT/ML IJ SOLN
5000.00 | INTRAMUSCULAR | Status: DC
Start: 2019-02-25 — End: 2019-02-25

## 2019-02-25 MED ORDER — ACETAMINOPHEN 325 MG PO TABS
650.00 | ORAL_TABLET | ORAL | Status: DC
Start: ? — End: 2019-02-25

## 2019-02-25 MED ORDER — LACTATED RINGERS IV SOLN
125.00 | INTRAVENOUS | Status: DC
Start: ? — End: 2019-02-25

## 2019-02-25 MED ORDER — HYDROMORPHONE HCL 1 MG/ML IJ SOLN
1.00 | INTRAMUSCULAR | Status: DC
Start: ? — End: 2019-02-25

## 2019-03-29 ENCOUNTER — Ambulatory Visit: Payer: Medicare Other

## 2019-12-25 IMAGING — DX DG WRIST 2V*L*
2 series · 2 of 2 positions shown · non-contrast
Comparison: None.

CLINICAL DATA: Patient reports dorsal left wrist pain for past week
without injury. Patient reports hx of gout. Denies any previous
injury to wrist.

EXAM:
LEFT WRIST - 2 VIEW

[wrist ap]
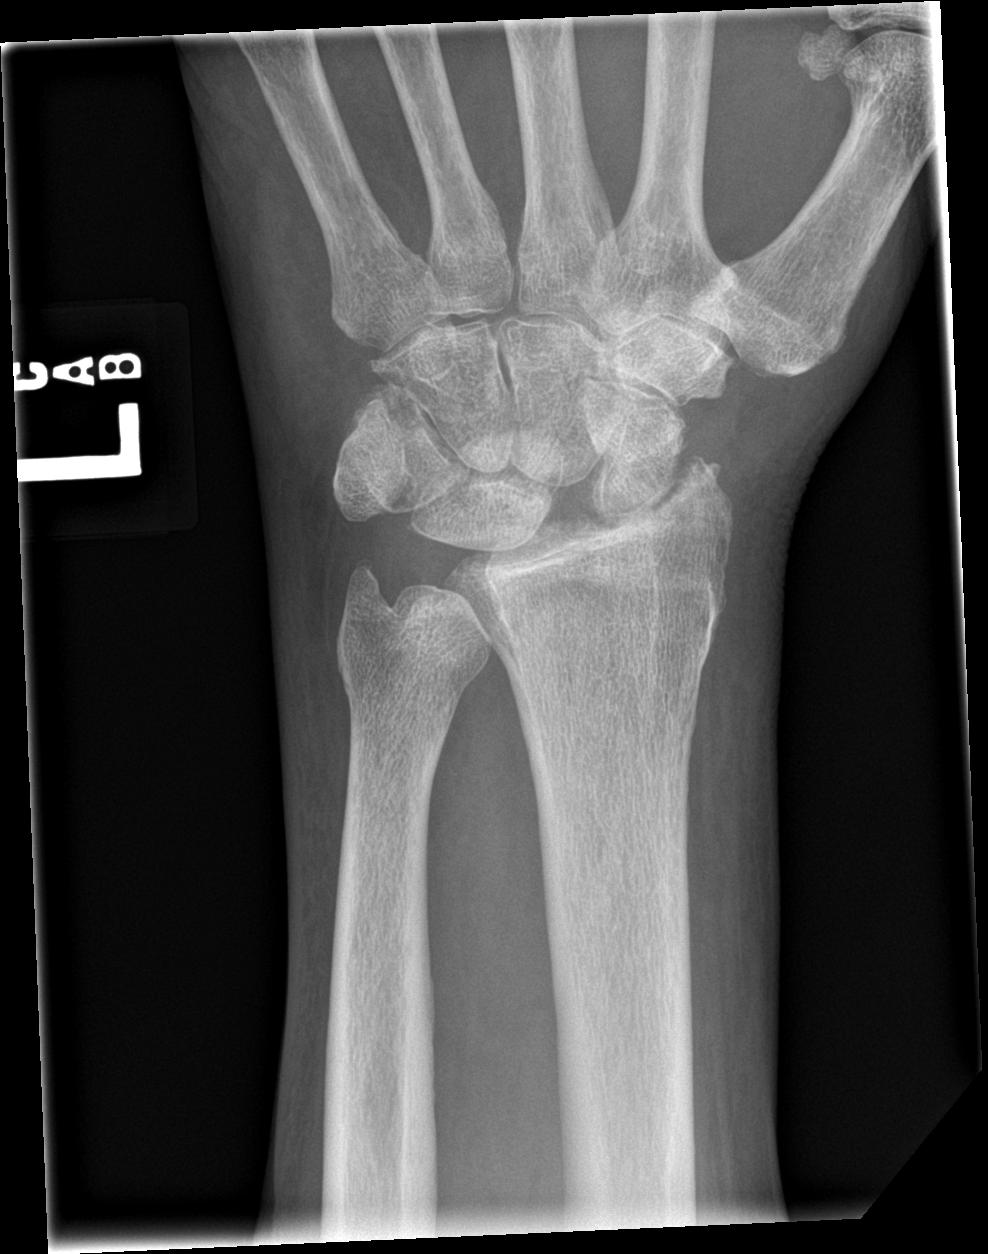

[wrist lat]
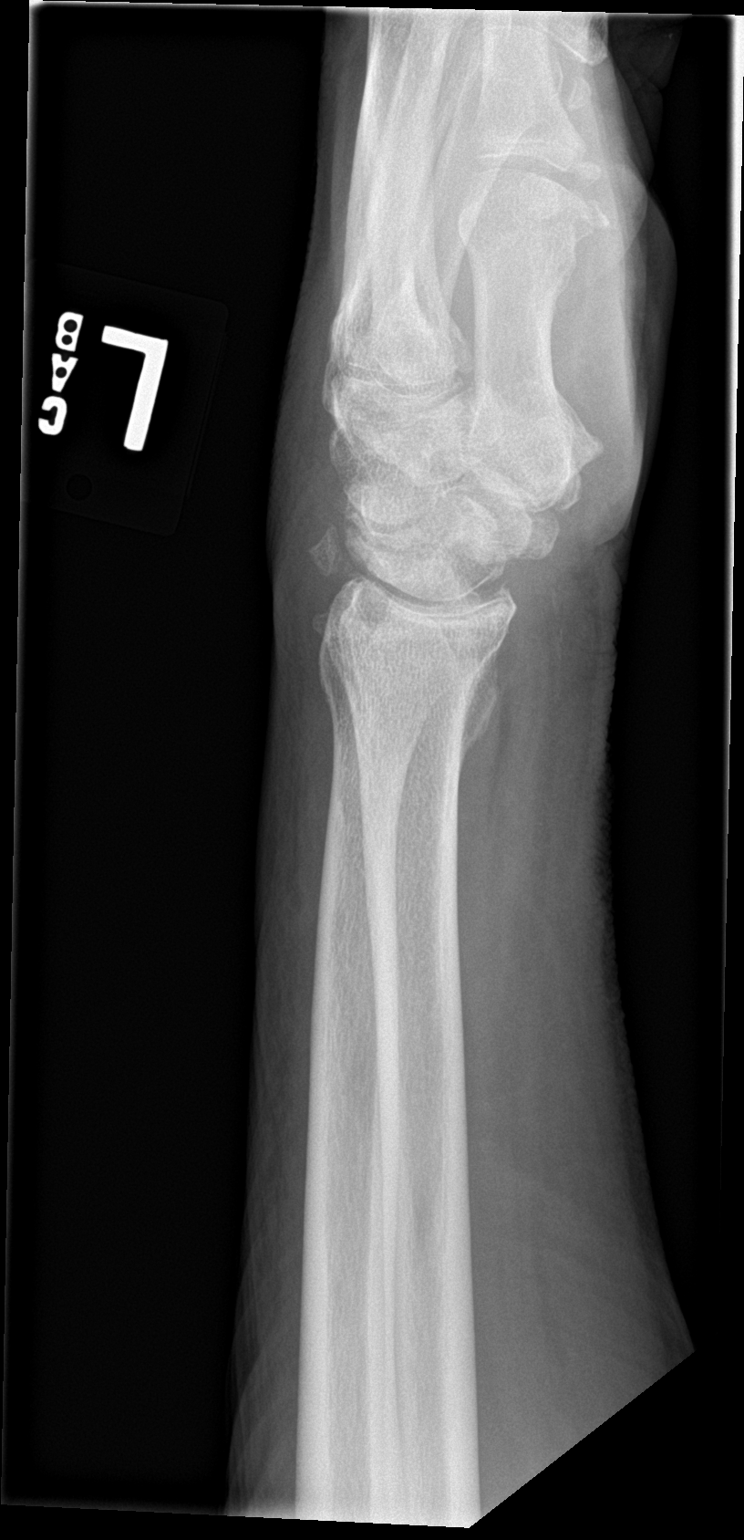

[2 of 2 positions shown; findings below may reference images not displayed]

FINDINGS: No acute fracture.

There is significant widening of the scapholunate interval,
measuring 6 mm. There is significant narrowing of the radial
scaphoid joint with irregularity of the subchondral bone along the
scaphoid facet of the distal radius and proximal articular aspect of
the scaphoid.

There is a triangular shaped piece of bone projecting posterior to
the radiocarpal wrist joint on the lateral view. The origin of this
bone fragment is unclear.

There is surrounding soft tissue swelling.
IMPRESSION: 1. Widened scapholunate interval consistent with scapholunate
ligament disruption.
2. Arthropathic changes at the distal radius scaphoid articulation.
3. Small bone fragment along the dorsal aspect of the wrist joint.
The source of this bone fragment is unclear. This is presumably
chronic finding.

## 2020-01-24 ENCOUNTER — Ambulatory Visit
Admission: RE | Admit: 2020-01-24 | Discharge: 2020-01-24 | Disposition: A | Discharge status: Home / Self Care | Payer: Medicare Other | Source: Ambulatory Visit | Attending: Physician Assistant | Admitting: Physician Assistant

## 2020-01-24 ENCOUNTER — Other Ambulatory Visit: Payer: Self-pay | Admitting: Physician Assistant

## 2020-01-24 ENCOUNTER — Other Ambulatory Visit: Payer: Self-pay

## 2020-01-24 ENCOUNTER — Ambulatory Visit
Admission: RE | Admit: 2020-01-24 | Discharge: 2020-01-24 | Disposition: A | Discharge status: Home / Self Care | Payer: Medicare Other | Attending: Physician Assistant | Admitting: Physician Assistant

## 2020-01-24 DIAGNOSIS — R058 Other specified cough: Secondary | ICD-10-CM

## 2020-03-01 ENCOUNTER — Other Ambulatory Visit: Payer: Self-pay

## 2020-03-01 ENCOUNTER — Encounter: Payer: Self-pay | Admitting: Ophthalmology

## 2020-03-06 ENCOUNTER — Other Ambulatory Visit: Admission: RE | Admit: 2020-03-06 | Payer: Medicare Other | Source: Ambulatory Visit

## 2020-03-07 ENCOUNTER — Other Ambulatory Visit: Payer: Self-pay

## 2020-03-07 ENCOUNTER — Other Ambulatory Visit
Admission: RE | Admit: 2020-03-07 | Discharge: 2020-03-07 | Disposition: A | Discharge status: Home / Self Care | Payer: Medicare Other | Source: Ambulatory Visit | Attending: Ophthalmology | Admitting: Ophthalmology

## 2020-03-07 DIAGNOSIS — Z01812 Encounter for preprocedural laboratory examination: Secondary | ICD-10-CM | POA: Diagnosis present

## 2020-03-07 DIAGNOSIS — U071 COVID-19: Secondary | ICD-10-CM

## 2020-03-07 HISTORY — DX: COVID-19: U07.1

## 2020-03-07 LAB — SARS CORONAVIRUS 2 (TAT 6-24 HRS): SARS Coronavirus 2: POSITIVE — AB

## 2020-03-07 NOTE — Discharge Instructions (Signed)

## 2020-03-14 ENCOUNTER — Other Ambulatory Visit: Payer: Self-pay

## 2020-03-14 ENCOUNTER — Encounter: Payer: Self-pay | Admitting: Ophthalmology

## 2020-03-20 ENCOUNTER — Inpatient Hospital Stay: Admission: RE | Admit: 2020-03-20 | Payer: Medicare Other | Source: Ambulatory Visit

## 2020-03-22 ENCOUNTER — Ambulatory Visit: Payer: Medicare Other | Admitting: Anesthesiology

## 2020-03-22 ENCOUNTER — Ambulatory Visit
Admission: RE | Admit: 2020-03-22 | Discharge: 2020-03-22 | Disposition: A | Discharge status: Home / Self Care | Payer: Medicare Other | Attending: Ophthalmology | Admitting: Ophthalmology

## 2020-03-22 ENCOUNTER — Encounter
Admission: RE | Disposition: A | Discharge status: Home / Self Care | Payer: Self-pay | Source: Home / Self Care | Attending: Ophthalmology

## 2020-03-22 ENCOUNTER — Other Ambulatory Visit: Payer: Self-pay

## 2020-03-22 ENCOUNTER — Encounter: Payer: Self-pay | Admitting: Ophthalmology

## 2020-03-22 DIAGNOSIS — Z7982 Long term (current) use of aspirin: Secondary | ICD-10-CM | POA: Insufficient documentation

## 2020-03-22 DIAGNOSIS — Z79899 Other long term (current) drug therapy: Secondary | ICD-10-CM | POA: Insufficient documentation

## 2020-03-22 DIAGNOSIS — H2511 Age-related nuclear cataract, right eye: Secondary | ICD-10-CM | POA: Insufficient documentation

## 2020-03-22 HISTORY — PX: CATARACT EXTRACTION W/PHACO: SHX586

## 2020-03-22 SURGERY — PHACOEMULSIFICATION, CATARACT, WITH IOL INSERTION
Anesthesia: Monitor Anesthesia Care | Site: Eye | Laterality: Right

## 2020-03-22 MED ORDER — CEFUROXIME OPHTHALMIC INJECTION 1 MG/0.1 ML
INJECTION | OPHTHALMIC | Status: DC | PRN
  Administered 2020-03-22: 0.1 mL via INTRACAMERAL

## 2020-03-22 MED ORDER — TETRACAINE HCL 0.5 % OP SOLN
1.0000 [drp] | OPHTHALMIC | Status: DC | PRN
  Administered 2020-03-22 (×3): 1 [drp] via OPHTHALMIC

## 2020-03-22 MED ORDER — EPINEPHRINE PF 1 MG/ML IJ SOLN
INTRAOCULAR | Status: DC | PRN
  Administered 2020-03-22: 87 mL via OPHTHALMIC

## 2020-03-22 MED ORDER — FENTANYL CITRATE (PF) 100 MCG/2ML IJ SOLN
INTRAMUSCULAR | Status: DC | PRN
  Administered 2020-03-22 (×2): 50 ug via INTRAVENOUS

## 2020-03-22 MED ORDER — LIDOCAINE HCL (PF) 2 % IJ SOLN
INTRAOCULAR | Status: DC | PRN
  Administered 2020-03-22: 1 mL

## 2020-03-22 MED ORDER — BRIMONIDINE TARTRATE-TIMOLOL 0.2-0.5 % OP SOLN
OPHTHALMIC | Status: DC | PRN
  Administered 2020-03-22: 1 [drp] via OPHTHALMIC

## 2020-03-22 MED ORDER — ARMC OPHTHALMIC DILATING DROPS
1.0000 "application " | OPHTHALMIC | Status: DC | PRN
  Administered 2020-03-22 (×3): 1 via OPHTHALMIC

## 2020-03-22 MED ORDER — LACTATED RINGERS IV SOLN: INTRAVENOUS | Status: DC

## 2020-03-22 MED ORDER — NA HYALUR & NA CHOND-NA HYALUR 0.4-0.35 ML IO KIT
PACK | INTRAOCULAR | Status: DC | PRN
  Administered 2020-03-22: 1 mL via INTRAOCULAR

## 2020-03-22 SURGICAL SUPPLY — 22 items
CANNULA ANT/CHMB 27G (MISCELLANEOUS) ×1 IMPLANT
CANNULA ANT/CHMB 27GA (MISCELLANEOUS) ×2 IMPLANT
GLOVE SURG LX 7.5 STRW (GLOVE) ×1
GLOVE SURG LX STRL 7.5 STRW (GLOVE) ×1 IMPLANT
GLOVE SURG TRIUMPH 8.0 PF LTX (GLOVE) ×2 IMPLANT
GOWN STRL REUS W/ TWL LRG LVL3 (GOWN DISPOSABLE) ×2 IMPLANT
GOWN STRL REUS W/TWL LRG LVL3 (GOWN DISPOSABLE) ×4
LENS IOL TECNIS EYHANCE 22.0 (Intraocular Lens) ×1 IMPLANT
MARKER SKIN DUAL TIP RULER LAB (MISCELLANEOUS) ×2 IMPLANT
NDL CAPSULORHEX 25GA (NEEDLE) ×1 IMPLANT
NDL FILTER BLUNT 18X1 1/2 (NEEDLE) ×2 IMPLANT
NEEDLE CAPSULORHEX 25GA (NEEDLE) ×2 IMPLANT
NEEDLE FILTER BLUNT 18X 1/2SAF (NEEDLE) ×2
NEEDLE FILTER BLUNT 18X1 1/2 (NEEDLE) ×2 IMPLANT
PACK CATARACT BRASINGTON (MISCELLANEOUS) ×2 IMPLANT
PACK EYE AFTER SURG (MISCELLANEOUS) ×2 IMPLANT
PACK OPTHALMIC (MISCELLANEOUS) ×2 IMPLANT
SOLUTION OPHTHALMIC SALT (MISCELLANEOUS) ×2 IMPLANT
SYR 3ML LL SCALE MARK (SYRINGE) ×4 IMPLANT
SYR TB 1ML LUER SLIP (SYRINGE) ×2 IMPLANT
WATER STERILE IRR 250ML POUR (IV SOLUTION) ×2 IMPLANT
WIPE NON LINTING 3.25X3.25 (MISCELLANEOUS) ×2 IMPLANT

## 2020-03-22 NOTE — Anesthesia Preprocedure Evaluation (Signed)
Anesthesia Evaluation  Patient identified by MRN, date of birth, ID band Patient awake    Reviewed: Allergy & Precautions, H&P , NPO status , Patient's Chart, lab work & pertinent test results, reviewed documented beta blocker date and time   Airway Mallampati: II  TM Distance: >3 FB Neck ROM: full    Dental no notable dental hx.    Pulmonary neg pulmonary ROS,    Pulmonary exam normal breath sounds clear to auscultation       Cardiovascular Exercise Tolerance: Good hypertension,  Rhythm:regular Rate:Normal     Neuro/Psych negative neurological ROS  negative psych ROS   GI/Hepatic negative GI ROS, Neg liver ROS,   Endo/Other  negative endocrine ROS  Renal/GU negative Renal ROS  negative genitourinary   Musculoskeletal   Abdominal   Peds  Hematology negative hematology ROS (+)   Anesthesia Other Findings   Reproductive/Obstetrics negative OB ROS                             Anesthesia Physical Anesthesia Plan  ASA: II  Anesthesia Plan: MAC   Post-op Pain Management:    Induction:   PONV Risk Score and Plan: 1 and Treatment may vary due to age or medical condition  Airway Management Planned:   Additional Equipment:   Intra-op Plan:   Post-operative Plan:   Informed Consent: I have reviewed the patients History and Physical, chart, labs and discussed the procedure including the risks, benefits and alternatives for the proposed anesthesia with the patient or authorized representative who has indicated his/her understanding and acceptance.     Dental Advisory Given  Plan Discussed with: CRNA  Anesthesia Plan Comments:         Anesthesia Quick Evaluation

## 2020-03-22 NOTE — Anesthesia Procedure Notes (Signed)
Procedure Name: MAC Date/Time: 03/22/2020 9:17 AM Performed by: Silvana Newness, CRNA Pre-anesthesia Checklist: Patient identified, Emergency Drugs available, Suction available, Patient being monitored and Timeout performed Patient Re-evaluated:Patient Re-evaluated prior to induction Oxygen Delivery Method: Nasal cannula Placement Confirmation: positive ETCO2

## 2020-03-22 NOTE — Op Note (Signed)
LOCATION:  [facility]   PREOPERATIVE DIAGNOSIS:    Nuclear sclerotic cataract right eye. H25.11   POSTOPERATIVE DIAGNOSIS:  Nuclear sclerotic cataract right eye.     PROCEDURE:  Phacoemusification with posterior chamber intraocular lens placement of the right eye   ULTRASOUND TIME: Procedure(s) with comments: CATARACT EXTRACTION PHACO AND INTRAOCULAR LENS PLACEMENT (IOC) RIGHT 13.10 01:35.8 13.7% (Right) - COVID + 03-07-20  LENS:   Implant Name Type Inv. Item Serial No. Manufacturer Lot No. LRB No. Used Action  LENS IOL TECNIS EYHANCE 22.0 - [id number] Intraocular Lens LENS IOL TECNIS EYHANCE 22.0 [id number]   Right 1 Implanted         SURGEON:  Deirdre Evener, MD   ANESTHESIA:  Topical with tetracaine drops and 2% Xylocaine jelly, augmented with 1% preservative-free intracameral lidocaine.    COMPLICATIONS:  None.   DESCRIPTION OF PROCEDURE:  The patient was identified in the holding room and transported to the operating room and placed in the supine position under the operating microscope.  The right eye was identified as the operative eye and it was prepped and draped in the usual sterile ophthalmic fashion.   A 1 millimeter clear-corneal paracentesis was made at the 12:00 position.  0.5 ml of preservative-free 1% lidocaine was injected into the anterior chamber. The anterior chamber was filled with Viscoat viscoelastic.  A 2.4 millimeter keratome was used to make a near-clear corneal incision at the 9:00 position.  A curvilinear capsulorrhexis was made with a cystotome and capsulorrhexis forceps.  Balanced salt solution was used to hydrodissect and hydrodelineate the nucleus.   Phacoemulsification was then used in stop and chop fashion to remove the lens nucleus and epinucleus.  The remaining cortex was then removed using the irrigation and aspiration handpiece. Provisc was then placed into the capsular bag to distend it for lens placement.  A lens was then  injected into the capsular bag.  The remaining viscoelastic was aspirated.   Wounds were hydrated with balanced salt solution.  The anterior chamber was inflated to a physiologic pressure with balanced salt solution.  No wound leaks were noted. Cefuroxime 0.1 ml of a 10mg /ml solution was injected into the anterior chamber for a dose of 1 mg of intracameral antibiotic at the completion of the case.   Timolol and Brimonidine drops were applied to the eye.  The patient was taken to the recovery room in stable condition without complications of anesthesia or surgery.   Ruthe Roemer 03/22/2020, 9:35 AM

## 2020-03-22 NOTE — H&P (Signed)

## 2020-03-22 NOTE — Anesthesia Postprocedure Evaluation (Signed)
Anesthesia Post Note  Patient: Richard Cervantes  Procedure(s) Performed: CATARACT EXTRACTION PHACO AND INTRAOCULAR LENS PLACEMENT (IOC) RIGHT 13.10 01:35.8 13.7% (Right Eye)     Patient location during evaluation: PACU Anesthesia Type: MAC Level of consciousness: awake and alert Pain management: pain level controlled Vital Signs Assessment: post-procedure vital signs reviewed and stable Respiratory status: spontaneous breathing, nonlabored ventilation, respiratory function stable and patient connected to nasal cannula oxygen Cardiovascular status: stable and blood pressure returned to baseline Postop Assessment: no apparent nausea or vomiting Anesthetic complications: no   No complications documented.  Alisa Graff

## 2020-03-22 NOTE — Transfer of Care (Signed)
Immediate Anesthesia Transfer of Care Note  Patient: Richard Cervantes  Procedure(s) Performed: CATARACT EXTRACTION PHACO AND INTRAOCULAR LENS PLACEMENT (IOC) RIGHT 13.10 01:35.8 13.7% (Right Eye)  Patient Location: PACU  Anesthesia Type: MAC  Level of Consciousness: awake, alert  and patient cooperative  Airway and Oxygen Therapy: Patient Spontanous Breathing and Patient connected to supplemental oxygen  Post-op Assessment: Post-op Vital signs reviewed, Patient's Cardiovascular Status Stable, Respiratory Function Stable, Patent Airway and No signs of Nausea or vomiting  Post-op Vital Signs: Reviewed and stable  Complications: No complications documented.

## 2020-03-23 ENCOUNTER — Encounter: Payer: Self-pay | Admitting: Ophthalmology

## 2020-04-26 ENCOUNTER — Other Ambulatory Visit: Payer: Self-pay

## 2020-04-26 ENCOUNTER — Encounter: Payer: Self-pay | Admitting: Ophthalmology

## 2020-05-01 ENCOUNTER — Other Ambulatory Visit: Payer: Self-pay

## 2020-05-01 ENCOUNTER — Other Ambulatory Visit
Admission: RE | Admit: 2020-05-01 | Discharge: 2020-05-01 | Disposition: A | Discharge status: Home / Self Care | Payer: Medicare Other | Source: Ambulatory Visit | Attending: Ophthalmology | Admitting: Ophthalmology

## 2020-05-01 DIAGNOSIS — Z01812 Encounter for preprocedural laboratory examination: Secondary | ICD-10-CM | POA: Diagnosis present

## 2020-05-01 DIAGNOSIS — Z20822 Contact with and (suspected) exposure to covid-19: Secondary | ICD-10-CM | POA: Diagnosis not present

## 2020-05-01 LAB — SARS CORONAVIRUS 2 (TAT 6-24 HRS): SARS Coronavirus 2: NEGATIVE

## 2020-05-02 NOTE — Discharge Instructions (Signed)

## 2020-05-03 ENCOUNTER — Ambulatory Visit: Payer: Medicare Other | Admitting: Anesthesiology

## 2020-05-03 ENCOUNTER — Encounter: Payer: Self-pay | Admitting: Ophthalmology

## 2020-05-03 ENCOUNTER — Other Ambulatory Visit: Payer: Self-pay

## 2020-05-03 ENCOUNTER — Ambulatory Visit
Admission: RE | Admit: 2020-05-03 | Discharge: 2020-05-03 | Disposition: A | Discharge status: Home / Self Care | Payer: Medicare Other | Attending: Ophthalmology | Admitting: Ophthalmology

## 2020-05-03 ENCOUNTER — Encounter
Admission: RE | Disposition: A | Discharge status: Home / Self Care | Payer: Self-pay | Source: Home / Self Care | Attending: Ophthalmology

## 2020-05-03 DIAGNOSIS — H2512 Age-related nuclear cataract, left eye: Secondary | ICD-10-CM | POA: Diagnosis not present

## 2020-05-03 HISTORY — PX: CATARACT EXTRACTION W/PHACO: SHX586

## 2020-05-03 SURGERY — PHACOEMULSIFICATION, CATARACT, WITH IOL INSERTION
Anesthesia: Monitor Anesthesia Care | Site: Eye | Laterality: Left

## 2020-05-03 MED ORDER — EPINEPHRINE PF 1 MG/ML IJ SOLN
INTRAOCULAR | Status: DC | PRN
  Administered 2020-05-03: 51 mL via OPHTHALMIC

## 2020-05-03 MED ORDER — ERYTHROMYCIN 5 MG/GM OP OINT
TOPICAL_OINTMENT | OPHTHALMIC | Status: DC | PRN
  Administered 2020-05-03: 1 via OPHTHALMIC

## 2020-05-03 MED ORDER — LIDOCAINE HCL (PF) 2 % IJ SOLN
INTRAOCULAR | Status: DC | PRN
  Administered 2020-05-03: 1 mL

## 2020-05-03 MED ORDER — BRIMONIDINE TARTRATE-TIMOLOL 0.2-0.5 % OP SOLN
OPHTHALMIC | Status: DC | PRN
  Administered 2020-05-03: 1 [drp] via OPHTHALMIC

## 2020-05-03 MED ORDER — CEFUROXIME OPHTHALMIC INJECTION 1 MG/0.1 ML
INJECTION | OPHTHALMIC | Status: DC | PRN
  Administered 2020-05-03: 0.1 mL via INTRACAMERAL

## 2020-05-03 MED ORDER — ARMC OPHTHALMIC DILATING DROPS
1.0000 "application " | OPHTHALMIC | Status: DC | PRN
  Administered 2020-05-03 (×3): 1 via OPHTHALMIC

## 2020-05-03 MED ORDER — TETRACAINE HCL 0.5 % OP SOLN
1.0000 [drp] | OPHTHALMIC | Status: DC | PRN
  Administered 2020-05-03 (×3): 1 [drp] via OPHTHALMIC

## 2020-05-03 MED ORDER — NA HYALUR & NA CHOND-NA HYALUR 0.4-0.35 ML IO KIT
PACK | INTRAOCULAR | Status: DC | PRN
  Administered 2020-05-03: 1 mL via INTRAOCULAR

## 2020-05-03 MED ORDER — FENTANYL CITRATE (PF) 100 MCG/2ML IJ SOLN
INTRAMUSCULAR | Status: DC | PRN
  Administered 2020-05-03: 50 ug via INTRAVENOUS

## 2020-05-03 SURGICAL SUPPLY — 22 items
CANNULA ANT/CHMB 27G (MISCELLANEOUS) ×1 IMPLANT
CANNULA ANT/CHMB 27GA (MISCELLANEOUS) ×2 IMPLANT
GLOVE SURG LX 7.5 STRW (GLOVE) ×1
GLOVE SURG LX STRL 7.5 STRW (GLOVE) ×1 IMPLANT
GLOVE SURG TRIUMPH 8.0 PF LTX (GLOVE) ×2 IMPLANT
GOWN STRL REUS W/ TWL LRG LVL3 (GOWN DISPOSABLE) ×2 IMPLANT
GOWN STRL REUS W/TWL LRG LVL3 (GOWN DISPOSABLE) ×4
LENS IOL TECNIS EYHANCE 22.5 (Intraocular Lens) ×1 IMPLANT
MARKER SKIN DUAL TIP RULER LAB (MISCELLANEOUS) ×2 IMPLANT
NDL CAPSULORHEX 25GA (NEEDLE) ×1 IMPLANT
NDL FILTER BLUNT 18X1 1/2 (NEEDLE) ×2 IMPLANT
NEEDLE CAPSULORHEX 25GA (NEEDLE) ×2 IMPLANT
NEEDLE FILTER BLUNT 18X 1/2SAF (NEEDLE) ×2
NEEDLE FILTER BLUNT 18X1 1/2 (NEEDLE) ×2 IMPLANT
PACK CATARACT BRASINGTON (MISCELLANEOUS) ×2 IMPLANT
PACK EYE AFTER SURG (MISCELLANEOUS) ×2 IMPLANT
PACK OPTHALMIC (MISCELLANEOUS) ×2 IMPLANT
SOLUTION OPHTHALMIC SALT (MISCELLANEOUS) ×2 IMPLANT
SYR 3ML LL SCALE MARK (SYRINGE) ×4 IMPLANT
SYR TB 1ML LUER SLIP (SYRINGE) ×2 IMPLANT
WATER STERILE IRR 250ML POUR (IV SOLUTION) ×2 IMPLANT
WIPE NON LINTING 3.25X3.25 (MISCELLANEOUS) ×2 IMPLANT

## 2020-05-03 NOTE — Progress Notes (Signed)
Not at risk at this time.

## 2020-05-03 NOTE — Transfer of Care (Signed)
Immediate Anesthesia Transfer of Care Note  Patient: Richard Cervantes  Procedure(s) Performed: CATARACT EXTRACTION PHACO AND INTRAOCULAR LENS PLACEMENT (IOC) LEFT 5.79 00:49.8 11.6% (Left Eye)  Patient Location: PACU  Anesthesia Type: MAC  Level of Consciousness: awake, alert  and patient cooperative  Airway and Oxygen Therapy: Patient Spontanous Breathing and Patient connected to supplemental oxygen  Post-op Assessment: Post-op Vital signs reviewed, Patient's Cardiovascular Status Stable, Respiratory Function Stable, Patent Airway and No signs of Nausea or vomiting  Post-op Vital Signs: Reviewed and stable  Complications: No complications documented.

## 2020-05-03 NOTE — Op Note (Signed)
OPERATIVE NOTE  Richard Cervantes [id number] 05/03/2020   PREOPERATIVE DIAGNOSIS:  Nuclear sclerotic cataract left eye. H25.12   POSTOPERATIVE DIAGNOSIS:    Nuclear sclerotic cataract left eye.     PROCEDURE:  Phacoemusification with posterior chamber intraocular lens placement of the left eye  Ultrasound time: Procedure(s) with comments: CATARACT EXTRACTION PHACO AND INTRAOCULAR LENS PLACEMENT (IOC) LEFT 5.79 00:49.8 11.6% (Left) - COVID + 03-07-20  LENS:   Implant Name Type Inv. Item Serial No. Manufacturer Lot No. LRB No. Used Action  LENS IOL TECNIS EYHANCE 22.5 - [id number] Intraocular Lens LENS IOL TECNIS EYHANCE 22.5 [id number] JOHNSON   Left 1 Implanted      SURGEON:  Deirdre Evener, MD   ANESTHESIA:  Topical with tetracaine drops and 2% Xylocaine jelly, augmented with 1% preservative-free intracameral lidocaine.    COMPLICATIONS:  None.   DESCRIPTION OF PROCEDURE:  The patient was identified in the holding room and transported to the operating room and placed in the supine position under the operating microscope.  The left eye was identified as the operative eye and it was prepped and draped in the usual sterile ophthalmic fashion.   A 1 millimeter clear-corneal paracentesis was made at the 1:30 position.  0.5 ml of preservative-free 1% lidocaine was injected into the anterior chamber.  The anterior chamber was filled with Viscoat viscoelastic.  A 2.4 millimeter keratome was used to make a near-clear corneal incision at the 10:30 position.  .  A curvilinear capsulorrhexis was made with a cystotome and capsulorrhexis forceps.  Balanced salt solution was used to hydrodissect and hydrodelineate the nucleus.   Phacoemulsification was then used in stop and chop fashion to remove the lens nucleus and epinucleus.  The remaining cortex was then removed using the irrigation and aspiration handpiece. Provisc was then placed into the capsular bag to distend it for lens placement.   A lens was then injected into the capsular bag.  The remaining viscoelastic was aspirated.   Wounds were hydrated with balanced salt solution.  The anterior chamber was inflated to a physiologic pressure with balanced salt solution.  No wound leaks were noted. Cefuroxime 0.1 ml of a 10mg /ml solution was injected into the anterior chamber for a dose of 1 mg of intracameral antibiotic at the completion of the case.   Timolol and Brimonidine drops and erythromycin ointment were applied to the eye.  The patient was taken to the recovery room in stable condition without complications of anesthesia or surgery.  [name],[name] 05/03/2020, 10:30 AM

## 2020-05-03 NOTE — Anesthesia Preprocedure Evaluation (Signed)
Anesthesia Evaluation  Patient identified by MRN, date of birth, ID band Patient awake    Reviewed: Allergy & Precautions, H&P , NPO status   Airway Mallampati: II  TM Distance: >3 FB Neck ROM: full    Dental no notable dental hx.    Pulmonary neg pulmonary ROS,    breath sounds clear to auscultation       Cardiovascular Exercise Tolerance: Good hypertension,  Rhythm:regular Rate:Normal     Neuro/Psych negative psych ROS   GI/Hepatic negative GI ROS, Neg liver ROS,   Endo/Other    Renal/GU      Musculoskeletal  (+) Arthritis ,   Abdominal   Peds  Hematology   Anesthesia Other Findings   Reproductive/Obstetrics                            Anesthesia Physical  Anesthesia Plan  ASA: II  Anesthesia Plan: MAC   Post-op Pain Management:    Induction:   PONV Risk Score and Plan: 1 and Treatment may vary due to age or medical condition  Airway Management Planned:   Additional Equipment:   Intra-op Plan:   Post-operative Plan:   Informed Consent: I have reviewed the patients History and Physical, chart, labs and discussed the procedure including the risks, benefits and alternatives for the proposed anesthesia with the patient or authorized representative who has indicated his/her understanding and acceptance.     Dental Advisory Given  Plan Discussed with: CRNA  Anesthesia Plan Comments:         Anesthesia Quick Evaluation

## 2020-05-03 NOTE — H&P (Signed)

## 2020-05-03 NOTE — Anesthesia Procedure Notes (Signed)
Procedure Name: MAC Date/Time: 05/03/2020 10:11 AM Performed by: Dionne Bucy, CRNA Pre-anesthesia Checklist: Patient identified, Emergency Drugs available, Suction available, Patient being monitored and Timeout performed Oxygen Delivery Method: Nasal cannula Placement Confirmation: positive ETCO2

## 2020-05-03 NOTE — Anesthesia Postprocedure Evaluation (Signed)
Anesthesia Post Note  Patient: Richard Cervantes  Procedure(s) Performed: CATARACT EXTRACTION PHACO AND INTRAOCULAR LENS PLACEMENT (IOC) LEFT 5.79 00:49.8 11.6% (Left Eye)     Patient location during evaluation: PACU Anesthesia Type: MAC Level of consciousness: awake Pain management: pain level controlled Vital Signs Assessment: post-procedure vital signs reviewed and stable Respiratory status: respiratory function stable Cardiovascular status: stable Postop Assessment: no apparent nausea or vomiting Anesthetic complications: no   No complications documented.  Veda Canning

## 2020-05-04 ENCOUNTER — Encounter: Payer: Self-pay | Admitting: Ophthalmology

## 2020-08-11 ENCOUNTER — Other Ambulatory Visit: Payer: Self-pay | Admitting: Interventional Radiology

## 2020-08-11 DIAGNOSIS — N1330 Unspecified hydronephrosis: Secondary | ICD-10-CM

## 2020-08-16 ENCOUNTER — Ambulatory Visit: Admission: RE | Admit: 2020-08-16 | Payer: Medicare Other | Source: Ambulatory Visit

## 2021-01-22 ENCOUNTER — Ambulatory Visit
Admission: RE | Admit: 2021-01-22 | Discharge: 2021-01-22 | Disposition: A | Discharge status: Home / Self Care | Payer: Medicare Other | Attending: Physician Assistant | Admitting: Physician Assistant

## 2021-01-22 ENCOUNTER — Ambulatory Visit
Admission: RE | Admit: 2021-01-22 | Discharge: 2021-01-22 | Disposition: A | Discharge status: Home / Self Care | Payer: Medicare Other | Source: Ambulatory Visit | Attending: Physician Assistant | Admitting: Physician Assistant

## 2021-01-22 ENCOUNTER — Other Ambulatory Visit: Payer: Self-pay | Admitting: Physician Assistant

## 2021-01-22 DIAGNOSIS — R058 Other specified cough: Secondary | ICD-10-CM | POA: Diagnosis present

## 2021-01-25 ENCOUNTER — Other Ambulatory Visit: Payer: Self-pay | Admitting: Physician Assistant

## 2021-01-25 DIAGNOSIS — R053 Chronic cough: Secondary | ICD-10-CM

## 2021-11-08 ENCOUNTER — Ambulatory Visit: Payer: Medicare Other | Admitting: Urology

## 2021-11-08 ENCOUNTER — Encounter: Payer: Self-pay | Admitting: Urology

## 2021-11-08 VITALS — BP 151/66 | HR 88 | Ht 69.0 in | Wt 185.0 lb

## 2021-11-08 DIAGNOSIS — R972 Elevated prostate specific antigen [PSA]: Secondary | ICD-10-CM

## 2021-11-08 NOTE — Progress Notes (Signed)
11/08/2021 4:57 PM   Richard Cervantes 06/19/1931 [id number]  Referring provider: Howard Pouch, NP [address] [place],  [place] [address]  Chief Complaint  Patient presents with   Elevated PSA    HPI: Richard Cervantes is a 88 y.o. male referred for evaluation of an elevated PSA.  He presents today with his daughter.  PSA 09/18/2021 elevated 19.4 and repeat 10/18/2021 was 22.8 Prior PSA June 2020 was 1.5 No bothersome LUTS; IPSS 1/35 No dysuria or gross hematuria.  Denies flank, abdominal or pelvic pain Daughter states he was treated for UTI a few months ago Prior history stone disease   PMH: Past Medical History:  Diagnosis Date   Arthritis    knees   COVID-19 03/07/2020   Gout    Hypertension     Surgical History: Past Surgical History:  Procedure Laterality Date   CATARACT EXTRACTION W/PHACO Right 03/22/2020   Procedure: CATARACT EXTRACTION PHACO AND INTRAOCULAR LENS PLACEMENT (IOC) RIGHT 13.10 01:35.8 13.7%;  Surgeon: Leandrew Koyanagi, MD;  Location: [facility];  Service: Ophthalmology;  Laterality: Right;  COVID + 03-07-20   CATARACT EXTRACTION W/PHACO Left 05/03/2020   Procedure: CATARACT EXTRACTION PHACO AND INTRAOCULAR LENS PLACEMENT (IOC) LEFT 5.79 00:49.8 11.6%;  Surgeon: Leandrew Koyanagi, MD;  Location: [facility];  Service: Ophthalmology;  Laterality: Left;  COVID + 03-07-20   CHOLECYSTECTOMY     HERNIA REPAIR     KIDNEY STONE SURGERY      Home Medications:  Allergies as of 11/08/2021       Reactions   Lactose Intolerance (gi)         Medication List        Accurate as of November 08, 2021  4:57 PM. If you have any questions, ask your nurse or doctor.          allopurinol 100 MG tablet Commonly known as: ZYLOPRIM Take 0.5 tablets (50 mg total) by mouth daily.   amLODipine 10 MG tablet Commonly known as: NORVASC Take 10 mg by mouth daily.   ASPIRIN 81 PO Take by mouth daily.   carvedilol 6.25 MG  tablet Commonly known as: COREG Take 6.25 mg by mouth 2 (two) times daily with a meal.   colchicine 0.6 MG tablet Take 0.5 tablets (0.3 mg total) by mouth daily.   feeding supplement Liqd Take 237 mLs by mouth 2 (two) times daily between meals.   fluticasone 50 MCG/ACT nasal spray Commonly known as: FLONASE Place into both nostrils daily.   losartan 50 MG tablet Commonly known as: COZAAR Take 50 mg by mouth daily.        Allergies:  Allergies  Allergen Reactions   Lactose Intolerance (Gi)     Family History: History reviewed. No pertinent family history.  Social History:  reports that he has never smoked. He has never used smokeless tobacco. He reports that he does not drink alcohol and does not use drugs.   Physical Exam: BP (!) 151/66   Pulse 88   Ht 5\' 9"  (1.753 m)   Wt 185 lb (83.9 kg)   BMI 27.32 kg/m   Constitutional:  Alert and oriented, No acute distress. HEENT: [place] AT Respiratory: Normal respiratory effort, no increased work of breathing. GU: Prostate 35 g, slightly firm.  No nodules Skin: No rashes, bruises or suspicious lesions. Psychiatric: Normal mood and affect.  Laboratory Data:   Urinalysis Microscopy 11-30 WBC   Assessment & Plan:    1.  Elevated PSA UA today with  significant pyuria and discussed with patient and daughter this could be a cause of his PSA elevation A urine culture was ordered and will await on results Will obtain a follow-up UA 1 month after treatment and repeat PSA if urine clear Although PSA is a prostate cancer screening test he was informed that cancer is not the most common cause of an elevated PSA. Other potential causes including BPH and inflammation were discussed. He was informed that the only way to adequately diagnose prostate cancer would be a transrectal ultrasound and biopsy of the prostate. The procedure was discussed including potential risks of bleeding and infection/sepsis.     Abbie Sons,  [place] [address], [place],  [address] [phone]

## 2021-11-09 LAB — URINALYSIS, COMPLETE
Bilirubin, UA: NEGATIVE
Glucose, UA: NEGATIVE
Ketones, UA: NEGATIVE
Nitrite, UA: NEGATIVE
Protein,UA: NEGATIVE
RBC, UA: NEGATIVE
Specific Gravity, UA: 1.025 (ref 1.005–1.030)
Urobilinogen, Ur: 1 mg/dL (ref 0.2–1.0)
pH, UA: 5 (ref 5.0–7.5)

## 2021-11-09 LAB — MICROSCOPIC EXAMINATION

## 2021-11-10 ENCOUNTER — Encounter: Payer: Self-pay | Admitting: Urology

## 2021-11-14 LAB — CULTURE, URINE COMPREHENSIVE

## 2021-11-15 ENCOUNTER — Other Ambulatory Visit: Payer: Self-pay | Admitting: *Deleted

## 2021-11-15 ENCOUNTER — Telehealth: Payer: Self-pay | Admitting: *Deleted

## 2021-11-15 DIAGNOSIS — R972 Elevated prostate specific antigen [PSA]: Secondary | ICD-10-CM

## 2021-11-15 MED ORDER — AMOXICILLIN 875 MG PO TABS: 875.0000 mg | ORAL_TABLET | Freq: Two times a day (BID) | ORAL | 0 refills | Status: AC

## 2021-11-15 NOTE — Telephone Encounter (Signed)
-----   Message from Abbie Sons, MD sent at 11/15/2021  9:33 AM EDT ----- Urine culture was positive.  Please send in Rx amoxicillin 875 mg twice daily x14 days.  Schedule lab visit for repeat UA in 2 weeks

## 2021-11-15 NOTE — Telephone Encounter (Signed)
Notified patient as instructed, patient pleased. Discussed follow-up appointments, patient agrees  

## 2021-11-29 ENCOUNTER — Other Ambulatory Visit: Payer: Medicare Other

## 2021-11-29 DIAGNOSIS — R972 Elevated prostate specific antigen [PSA]: Secondary | ICD-10-CM

## 2021-11-29 LAB — URINALYSIS, COMPLETE
Bilirubin, UA: NEGATIVE
Glucose, UA: NEGATIVE
Ketones, UA: NEGATIVE
Nitrite, UA: NEGATIVE
Protein,UA: NEGATIVE
RBC, UA: NEGATIVE
Specific Gravity, UA: 1.015 (ref 1.005–1.030)
Urobilinogen, Ur: 0.2 mg/dL (ref 0.2–1.0)
pH, UA: 6 (ref 5.0–7.5)

## 2021-11-29 LAB — MICROSCOPIC EXAMINATION: Bacteria, UA: NONE SEEN

## 2021-12-03 LAB — CULTURE, URINE COMPREHENSIVE

## 2021-12-04 ENCOUNTER — Telehealth: Payer: Self-pay | Admitting: *Deleted

## 2021-12-04 DIAGNOSIS — R972 Elevated prostate specific antigen [PSA]: Secondary | ICD-10-CM

## 2021-12-04 NOTE — Telephone Encounter (Signed)
-----   Message from Abbie Sons, MD sent at 12/04/2021  7:41 AM EDT ----- Urine culture was negative.  Have him come in for a follow-up PSA.

## 2021-12-05 ENCOUNTER — Other Ambulatory Visit: Payer: Medicare Other

## 2021-12-05 DIAGNOSIS — R972 Elevated prostate specific antigen [PSA]: Secondary | ICD-10-CM

## 2021-12-05 NOTE — Telephone Encounter (Signed)
Notified patient as instructed, patient pleased. Discussed follow-up appointments, patient agrees  

## 2021-12-06 LAB — PSA: Prostate Specific Ag, Serum: 16.8 ng/mL — ABNORMAL HIGH (ref 0.0–4.0)

## 2021-12-21 ENCOUNTER — Ambulatory Visit: Payer: Medicare Other | Admitting: Urology

## 2021-12-21 VITALS — BP 152/63 | HR 88 | Ht 69.0 in | Wt 185.0 lb

## 2021-12-21 DIAGNOSIS — R8281 Pyuria: Secondary | ICD-10-CM | POA: Diagnosis not present

## 2021-12-21 DIAGNOSIS — R972 Elevated prostate specific antigen [PSA]: Secondary | ICD-10-CM | POA: Diagnosis not present

## 2021-12-21 LAB — URINALYSIS, COMPLETE
Bilirubin, UA: NEGATIVE
Glucose, UA: NEGATIVE
Ketones, UA: NEGATIVE
Nitrite, UA: NEGATIVE
Protein,UA: NEGATIVE
RBC, UA: NEGATIVE
Specific Gravity, UA: 1.02 (ref 1.005–1.030)
Urobilinogen, Ur: 0.2 mg/dL (ref 0.2–1.0)
pH, UA: 5 (ref 5.0–7.5)

## 2021-12-21 LAB — MICROSCOPIC EXAMINATION

## 2021-12-21 NOTE — Progress Notes (Unsigned)
12/21/2021 2:25 PM   [name] 1931/05/29 [id number]  Referring provider: [facility] [address] [place],  [place] [address]  Chief Complaint  Patient presents with   Follow-up    HPI: 88 y.o. male presents for 1 month follow-up.  Initially seen 11/08/2021 for a PSA of 19.4/22.8 on repeat Prior PSA June 2020 was 1.5 No bothersome LUTS UA had significant pyuria; urine grew Enterococcus which was treated Repeat PSA was 16.8 though still had pyuria; urine culture grew mixed flora No voiding complaints today   PMH: Past Medical History:  Diagnosis Date   Arthritis    knees   COVID-19 03/07/2020   Gout    Hypertension     Surgical History: Past Surgical History:  Procedure Laterality Date   CATARACT EXTRACTION W/PHACO Right 03/22/2020   Procedure: CATARACT EXTRACTION PHACO AND INTRAOCULAR LENS PLACEMENT (IOC) RIGHT 13.10 01:35.8 13.7%;  Surgeon: Lockie Mola, MD;  Location: [facility];  Service: Ophthalmology;  Laterality: Right;  COVID + 03-07-20   CATARACT EXTRACTION W/PHACO Left 05/03/2020   Procedure: CATARACT EXTRACTION PHACO AND INTRAOCULAR LENS PLACEMENT (IOC) LEFT 5.79 00:49.8 11.6%;  Surgeon: Lockie Mola, MD;  Location: [facility];  Service: Ophthalmology;  Laterality: Left;  COVID + 03-07-20   CHOLECYSTECTOMY     HERNIA REPAIR     KIDNEY STONE SURGERY      Home Medications:  Allergies as of 12/21/2021       Reactions   Lactose    Lactose Intolerance (gi)         Medication List        Accurate as of December 21, 2021  2:25 PM. If you have any questions, ask your nurse or doctor.          allopurinol 100 MG tablet Commonly known as: ZYLOPRIM Take 0.5 tablets (50 mg total) by mouth daily.   amLODipine 10 MG tablet Commonly known as: NORVASC Take 10 mg by mouth daily.   ASPIRIN 81 PO Take by mouth daily.   carvedilol 6.25 MG tablet Commonly known as: COREG Take 6.25 mg by mouth 2  (two) times daily with a meal.   colchicine 0.6 MG tablet Take 0.5 tablets (0.3 mg total) by mouth daily.   feeding supplement Liqd Take 237 mLs by mouth 2 (two) times daily between meals.   fluticasone 50 MCG/ACT nasal spray Commonly known as: FLONASE Place into both nostrils daily.   losartan 50 MG tablet Commonly known as: COZAAR Take 50 mg by mouth daily.        Allergies:  Allergies  Allergen Reactions   Lactose    Lactose Intolerance (Gi)     Family History: No family history on file.  Social History:  reports that he has never smoked. He has never used smokeless tobacco. He reports that he does not drink alcohol and does not use drugs.   Physical Exam: BP (!) 152/63   Pulse 88   Ht 5\' 9"  (1.753 m)   Wt 185 lb (83.9 kg)   BMI 27.32 kg/m   Constitutional:  Alert and oriented, No acute distress. HEENT: [place] AT, moist mucus membranes.  Trachea midline, no masses. Cardiovascular: No clubbing, cyanosis, or edema. Respiratory: Normal respiratory effort, no increased work of breathing. Psychiatric: Normal mood and affect.  Laboratory Data:  Urinalysis Microscopy 6-10 WBC Trichomonads present   Assessment & Plan:    1. Elevated PSA Repeat PSA today and if persistently elevated discussed scheduling prostate biopsy.  The procedure was discussed including potential risks of bleeding and infection/sepsis  2. Pyuria Urine results received after patient left office and showed mild pyuria and trichomonads. Chlamydia/GC/trichomonas NAA ordered for verification   Abbie Sons, [place] [address] [place], [place] [address] [phone]

## 2021-12-22 LAB — PSA: Prostate Specific Ag, Serum: 19.6 ng/mL — ABNORMAL HIGH (ref 0.0–4.0)

## 2021-12-23 ENCOUNTER — Encounter: Payer: Self-pay | Admitting: Urology

## 2021-12-24 LAB — CULTURE, URINE COMPREHENSIVE

## 2021-12-25 LAB — CHLAMYDIA/GONOCOCCUS/TRICHOMONAS, NAA
Chlamydia by NAA: NEGATIVE
Gonococcus by NAA: NEGATIVE
Trich vag by NAA: POSITIVE — AB

## 2021-12-26 ENCOUNTER — Telehealth: Payer: Self-pay | Admitting: *Deleted

## 2021-12-26 MED ORDER — METRONIDAZOLE 500 MG PO TABS: ORAL_TABLET | ORAL | 0 refills | Status: DC

## 2021-12-26 NOTE — Telephone Encounter (Signed)
-----   Message from Riki Altes, MD sent at 12/25/2021  7:34 PM EST ----- Urine testing was positive for trichomonads.  Please send in Rx metronidazole 500 milligrams-4 tabs p.o. once.  He needs to abstain from intercourse for 7 days and his sexual partner also needs to be evaluated and treated by their PCP

## 2021-12-31 NOTE — Telephone Encounter (Signed)
Notified patient as instructed,.  

## 2022-02-12 ENCOUNTER — Other Ambulatory Visit: Payer: Self-pay | Admitting: *Deleted

## 2022-02-12 DIAGNOSIS — R972 Elevated prostate specific antigen [PSA]: Secondary | ICD-10-CM

## 2022-02-13 ENCOUNTER — Other Ambulatory Visit: Payer: Medicare Other

## 2022-02-13 DIAGNOSIS — R972 Elevated prostate specific antigen [PSA]: Secondary | ICD-10-CM

## 2022-02-13 LAB — URINALYSIS, COMPLETE
Bilirubin, UA: NEGATIVE
Glucose, UA: NEGATIVE
Ketones, UA: NEGATIVE
Leukocytes,UA: NEGATIVE
Nitrite, UA: NEGATIVE
RBC, UA: NEGATIVE
Specific Gravity, UA: 1.02 (ref 1.005–1.030)
Urobilinogen, Ur: 0.2 mg/dL (ref 0.2–1.0)
pH, UA: 7 (ref 5.0–7.5)

## 2022-02-13 LAB — MICROSCOPIC EXAMINATION

## 2022-02-14 ENCOUNTER — Telehealth: Payer: Self-pay | Admitting: *Deleted

## 2022-02-14 LAB — PSA: Prostate Specific Ag, Serum: 18.2 ng/mL — ABNORMAL HIGH (ref 0.0–4.0)

## 2022-02-14 NOTE — Telephone Encounter (Signed)
-----   Message from Riki Altes, MD sent at 02/14/2022 10:25 AM EST ----- Repeat PSA persistently elevated at 18.2 which is suspicious for prostate cancer.  Recommend scheduling biopsy or a follow-up appointment to discuss options.  Daughter is on [place]

## 2022-02-15 NOTE — Telephone Encounter (Signed)
Notified patient daughter as instructed,  scheduled appt. To discuss biopsy

## 2022-02-21 ENCOUNTER — Ambulatory Visit: Payer: Medicare Other | Admitting: Urology

## 2022-02-21 ENCOUNTER — Encounter: Payer: Self-pay | Admitting: Urology

## 2022-02-21 VITALS — BP 179/69 | HR 86 | Ht 69.0 in | Wt 186.5 lb

## 2022-02-21 DIAGNOSIS — R972 Elevated prostate specific antigen [PSA]: Secondary | ICD-10-CM | POA: Diagnosis not present

## 2022-02-21 NOTE — Progress Notes (Signed)
02/21/2022 1:21 PM   Richard Cervantes [facility] 1931-03-18 [id number]  Referring provider: [facility] [address] [place],  [place] [address]  Chief Complaint  Patient presents with   Follow-up    HPI: 89 y.o. male presents for follow-up of an elevated PSA.  He presents today with his daughter.  Repeat UA December 2023 was clear and PSA drawn at that time persistently elevated at 18.2.  They present today to discuss options Presently has no voiding complaints.   PMH: Past Medical History:  Diagnosis Date   Arthritis    knees   COVID-19 03/07/2020   Gout    Hypertension     Surgical History: Past Surgical History:  Procedure Laterality Date   CATARACT EXTRACTION W/PHACO Right 03/22/2020   Procedure: CATARACT EXTRACTION PHACO AND INTRAOCULAR LENS PLACEMENT (IOC) RIGHT 13.10 01:35.8 13.7%;  Surgeon: Leandrew Koyanagi, MD;  Location: [facility];  Service: Ophthalmology;  Laterality: Right;  COVID + 03-07-20   CATARACT EXTRACTION W/PHACO Left 05/03/2020   Procedure: CATARACT EXTRACTION PHACO AND INTRAOCULAR LENS PLACEMENT (IOC) LEFT 5.79 00:49.8 11.6%;  Surgeon: Leandrew Koyanagi, MD;  Location: [facility];  Service: Ophthalmology;  Laterality: Left;  COVID + 03-07-20   CHOLECYSTECTOMY     HERNIA REPAIR     KIDNEY STONE SURGERY      Home Medications:  Allergies as of 02/21/2022       Reactions   Lactose    Lactose Intolerance (gi)         Medication List        Accurate as of February 21, 2022  1:21 PM. If you have any questions, ask your nurse or doctor.          STOP taking these medications    losartan 50 MG tablet Commonly known as: COZAAR Stopped by: Abbie Sons, MD   metroNIDAZOLE 500 MG tablet Commonly known as: Flagyl Stopped by: Abbie Sons, MD       TAKE these medications    allopurinol 100 MG tablet Commonly known as: ZYLOPRIM Take 0.5 tablets (50 mg total) by mouth daily.   amLODipine 10 MG  tablet Commonly known as: NORVASC Take 10 mg by mouth daily.   ASPIRIN 81 PO Take by mouth daily.   carvedilol 6.25 MG tablet Commonly known as: COREG Take 6.25 mg by mouth 2 (two) times daily with a meal.   colchicine 0.6 MG tablet Take 0.5 tablets (0.3 mg total) by mouth daily.   enalapril 2.5 MG tablet Commonly known as: VASOTEC Take 2.5 mg by mouth daily.   feeding supplement Liqd Take 237 mLs by mouth 2 (two) times daily between meals.   fluticasone 50 MCG/ACT nasal spray Commonly known as: FLONASE Place into both nostrils daily.        Allergies:  Allergies  Allergen Reactions   Lactose    Lactose Intolerance (Gi)     Family History: History reviewed. No pertinent family history.  Social History:  reports that he has never smoked. He has never used smokeless tobacco. He reports that he does not drink alcohol and does not use drugs.   Physical Exam: BP (!) 179/69   Pulse 86   Ht 5\' 9"  (1.753 m)   Wt 186 lb 8 oz (84.6 kg)   BMI 27.54 kg/m   Constitutional:  Alert and oriented, No acute distress. HEENT:  AT Respiratory: Normal respiratory effort, no increased work of breathing. Psychiatric: Normal mood and affect.   Assessment &  Plan:    1. Elevated PSA Most recent PSA 18 with a negative UA We discussed PSA levels >10 are more suspicious for prostate cancer.  We also discussed not all prostate cancer is treated Prostate biopsy was discussed including potential risks of bleeding and infection/sepsis The alternative of surveillance was also discussed. He wants to hold off on biopsy at this time.  Will repeat his PSA in 3 months and if rising he thinks he will proceed with biopsy.   Abbie Sons, [place] [address] [place], [place] [address] [phone]

## 2022-04-24 ENCOUNTER — Other Ambulatory Visit: Payer: Medicare Other

## 2022-04-24 DIAGNOSIS — R972 Elevated prostate specific antigen [PSA]: Secondary | ICD-10-CM

## 2022-04-25 LAB — PSA: Prostate Specific Ag, Serum: 29.4 ng/mL — ABNORMAL HIGH (ref 0.0–4.0)

## 2022-05-05 ENCOUNTER — Telehealth: Payer: Self-pay | Admitting: Urology

## 2022-05-05 NOTE — Telephone Encounter (Signed)
PSA has increased from 18-29.4.  He indicated at last office visit he wanted to proceed with prostate biopsy if PSA was rising.

## 2022-05-06 NOTE — Telephone Encounter (Signed)
Pts daughter aware-   Appt made 05/29/22 at 115.   Not on blood thinners.  Stop ASA 5 days before. Fleet enema 2 hours before.

## 2022-05-29 ENCOUNTER — Encounter: Payer: Self-pay | Admitting: Urology

## 2022-05-29 ENCOUNTER — Ambulatory Visit: Payer: Medicare Other | Admitting: Urology

## 2022-05-29 VITALS — BP 93/54 | HR 81 | Ht 69.0 in | Wt 185.0 lb

## 2022-05-29 DIAGNOSIS — R972 Elevated prostate specific antigen [PSA]: Secondary | ICD-10-CM

## 2022-05-29 NOTE — Patient Instructions (Signed)
Prostate Biopsy Instructions  Stop all aspirin or blood thinners (aspirin, plavix, coumadin, warfarin, motrin, ibuprofen, advil, aleve, naproxen, naprosyn) for 7 days prior to the procedure.  If you have any questions about stopping these medications, please contact your primary care physician or cardiologist.  Having a light meal prior to the procedure is recommended.  If you are diabetic or have low blood sugar please bring a small snack or glucose tablet.  A Fleets enema is needed to be purchased over the counter at a local pharmacy and used 2 hours before you scheduled appointment.  This can be purchased over the counter at any pharmacy.  Antibiotics will be administered in the clinic at the time of the procedure unless otherwise specified.    Please bring someone with you to the procedure to drive you home.  A follow up appointment has been scheduled for you to receive the results of the biopsy.  If you have any questions or concerns, please feel free to call the office at [phone] or send a Mychart message.    Thank you, Staff at  Urology  

## 2022-05-30 NOTE — Progress Notes (Signed)
Patient did not do antibiotic and biopsy was rescheduled

## 2022-06-14 ENCOUNTER — Encounter: Payer: Self-pay | Admitting: Urology

## 2022-06-14 ENCOUNTER — Ambulatory Visit: Payer: Medicare Other | Admitting: Urology

## 2022-06-14 VITALS — BP 144/70 | HR 89 | Ht 69.0 in | Wt 185.0 lb

## 2022-06-14 DIAGNOSIS — C61 Malignant neoplasm of prostate: Secondary | ICD-10-CM

## 2022-06-14 DIAGNOSIS — R972 Elevated prostate specific antigen [PSA]: Secondary | ICD-10-CM

## 2022-06-14 DIAGNOSIS — Z2989 Encounter for other specified prophylactic measures: Secondary | ICD-10-CM | POA: Diagnosis not present

## 2022-06-14 MED ORDER — GENTAMICIN SULFATE 40 MG/ML IJ SOLN
80.0000 mg | Freq: Once | INTRAMUSCULAR | Status: AC
  Administered 2022-06-14: 80 mg via INTRAMUSCULAR

## 2022-06-14 MED ORDER — LEVOFLOXACIN 500 MG PO TABS
500.0000 mg | ORAL_TABLET | Freq: Once | ORAL | Status: AC
  Administered 2022-06-14: 500 mg via ORAL

## 2022-06-14 NOTE — Progress Notes (Signed)
Prostate Biopsy Procedure   Informed consent was obtained after discussing risks/benefits of the procedure.  A time out was performed to ensure correct patient identity.  Pre-Procedure: - Last PSA Level: 29.4 on 04/24/2022; firm right prostate - Gentamicin given prophylactically - Levaquin 500 mg administered PO -Transrectal Ultrasound performed revealing a 47 gm prostate -hypoechoic right PZ mid gland to apex  Procedure: - Prostate block performed using 10 cc 1% lidocaine and biopsies taken from sextant areas, a total of 6 under ultrasound guidance.  Post-Procedure: - Patient tolerated the procedure well - He was counseled to seek immediate medical attention if experiences any severe pain, significant bleeding, or fevers - Will call with biopsy results   Maansi Wike C. Lonna Cobb, MD

## 2022-06-17 LAB — SURGICAL PATHOLOGY

## 2022-06-20 ENCOUNTER — Telehealth: Payer: Self-pay | Admitting: Urology

## 2022-06-20 DIAGNOSIS — C61 Malignant neoplasm of prostate: Secondary | ICD-10-CM

## 2022-06-20 NOTE — Telephone Encounter (Signed)
Ms. Richard Cervantes called back and the pathology report was discussed in detail.  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  I recommended PSMA/PET for metastatic evaluation.  His life expectancy is <5 years based on SSA actuarial tables and if he has no evidence of metastatic disease observation is the current NCCN recommendation.  If there is evidence of metastatic disease would recommend hormonal therapy.  All questions were answered and order for PSMA/PET was placed

## 2022-06-20 NOTE — Telephone Encounter (Signed)
I contacted patient to discuss his prostate biopsy results.  His preferred contact was his daughter [name] who is listed on his DPR.  Left a message on her voicemail to call back

## 2022-07-03 ENCOUNTER — Ambulatory Visit
Admission: RE | Admit: 2022-07-03 | Discharge: 2022-07-03 | Disposition: A | Discharge status: Home / Self Care | Payer: Medicare Other | Source: Ambulatory Visit | Attending: Urology | Admitting: Urology

## 2022-07-03 DIAGNOSIS — C61 Malignant neoplasm of prostate: Secondary | ICD-10-CM

## 2022-07-03 MED ORDER — PIFLIFOLASTAT F 18 (PYLARIFY) INJECTION
9.0000 | Freq: Once | INTRAVENOUS | Status: AC
  Administered 2022-07-03: 8.3 via INTRAVENOUS

## 2022-07-05 ENCOUNTER — Telehealth: Payer: Self-pay | Admitting: Urology

## 2022-07-05 NOTE — Telephone Encounter (Signed)
I contacted Richard Cervantes's daughter, Richard Cervantes to discuss his PSMA/PET scan.  There was no evidence of adenopathy, skeletal or visceral metastasis.  He most likely has some early extension into the base of the seminal vesicles bilaterally.  Again reviewed NCCN management options based on his life expectancy which is observation.  Hormonal therapy and radiation therapy were also discussed.  His daughter states he is very active and still drives and we discussed ADT would most likely make an impact on his quality of life.  She will discuss with her father but at this point would elect observation.  Will schedule a 21-month follow-up appointment with PSA.

## 2022-07-05 NOTE — Telephone Encounter (Signed)
Spoke to patient's daughter and scheduled him for a 3 month follow up.

## 2022-07-10 ENCOUNTER — Emergency Department: Payer: Medicare Other

## 2022-07-10 ENCOUNTER — Other Ambulatory Visit: Payer: Self-pay

## 2022-07-10 ENCOUNTER — Emergency Department
Admission: EM | Admit: 2022-07-10 | Discharge: 2022-07-10 | Disposition: A | Discharge status: Home / Self Care | Payer: Medicare Other | Attending: Emergency Medicine | Admitting: Emergency Medicine

## 2022-07-10 DIAGNOSIS — W19XXXA Unspecified fall, initial encounter: Secondary | ICD-10-CM | POA: Insufficient documentation

## 2022-07-10 DIAGNOSIS — R42 Dizziness and giddiness: Secondary | ICD-10-CM | POA: Diagnosis present

## 2022-07-10 LAB — CBC WITH DIFFERENTIAL/PLATELET
Abs Immature Granulocytes: 0.11 10*3/uL — ABNORMAL HIGH (ref 0.00–0.07)
Basophils Absolute: 0 10*3/uL (ref 0.0–0.1)
Basophils Relative: 0 %
Eosinophils Absolute: 0.1 10*3/uL (ref 0.0–0.5)
Eosinophils Relative: 1 %
HCT: 34.5 % — ABNORMAL LOW (ref 39.0–52.0)
Hemoglobin: 11.4 g/dL — ABNORMAL LOW (ref 13.0–17.0)
Immature Granulocytes: 1 %
Lymphocytes Relative: 20 %
Lymphs Abs: 1.9 10*3/uL (ref 0.7–4.0)
MCH: 32.1 pg (ref 26.0–34.0)
MCHC: 33 g/dL (ref 30.0–36.0)
MCV: 97.2 fL (ref 80.0–100.0)
Monocytes Absolute: 0.8 10*3/uL (ref 0.1–1.0)
Monocytes Relative: 8 %
Neutro Abs: 6.7 10*3/uL (ref 1.7–7.7)
Neutrophils Relative %: 70 %
Platelets: 311 10*3/uL (ref 150–400)
RBC: 3.55 MIL/uL — ABNORMAL LOW (ref 4.22–5.81)
RDW: 14.2 % (ref 11.5–15.5)
WBC: 9.7 10*3/uL (ref 4.0–10.5)
nRBC: 0 % (ref 0.0–0.2)

## 2022-07-10 LAB — URINALYSIS, ROUTINE W REFLEX MICROSCOPIC
Bilirubin Urine: NEGATIVE
Glucose, UA: NEGATIVE mg/dL
Hgb urine dipstick: NEGATIVE
Ketones, ur: NEGATIVE mg/dL
Leukocytes,Ua: NEGATIVE
Nitrite: NEGATIVE
Protein, ur: NEGATIVE mg/dL
Specific Gravity, Urine: 1.024 (ref 1.005–1.030)
pH: 5 (ref 5.0–8.0)

## 2022-07-10 LAB — BASIC METABOLIC PANEL
Anion gap: 7 (ref 5–15)
BUN: 37 mg/dL — ABNORMAL HIGH (ref 8–23)
CO2: 25 mmol/L (ref 22–32)
Calcium: 8.4 mg/dL — ABNORMAL LOW (ref 8.9–10.3)
Chloride: 103 mmol/L (ref 98–111)
Creatinine, Ser: 1.35 mg/dL — ABNORMAL HIGH (ref 0.61–1.24)
GFR, Estimated: 50 mL/min — ABNORMAL LOW (ref >60)
Glucose, Bld: 106 mg/dL — ABNORMAL HIGH (ref 70–99)
Potassium: 4.5 mmol/L (ref 3.5–5.1)
Sodium: 135 mmol/L (ref 135–145)

## 2022-07-10 LAB — TROPONIN I (HIGH SENSITIVITY): Troponin I (High Sensitivity): 8 ng/L (ref <18)

## 2022-07-10 MED ORDER — MECLIZINE HCL 25 MG PO TABS: 25.0000 mg | ORAL_TABLET | Freq: Three times a day (TID) | ORAL | 0 refills | Status: AC | PRN

## 2022-07-10 NOTE — ED Provider Triage Note (Signed)
Emergency Medicine Provider Triage Evaluation Note  Richard Cervantes , a 89 y.o. male  was evaluated in triage.  Pt complains of dizziness. Has a history of renal failure and prostate cancer. Patient reports that he has been having "dizzy spells" and fell today. He reports that he fell onto his back. Reports that he also has a "sinus problem." He denies HS or LOC, reports that he fell onto his back. Reports that he is dizzy only when he moves his head. Reports that this has not happened to him before. Reports that he feels "woozy." Denies weakness or paresthesias. Denies CP/SOB.     Review of Systems  Positive: Intermittent dizziness Negative: CP,SOB, abd pain, weakness, paresthesias  Physical Exam  There were no vitals taken for this visit. Gen:   Awake, no distress   Resp:  Normal effort  MSK:   Moves extremities without difficulty  Other:  Face symmetric  Medical Decision Making  Medically screening exam initiated at 3:21 PM.  Appropriate orders placed.  Richard Cervantes was informed that the remainder of the evaluation will be completed by another provider, this initial triage assessment does not replace that evaluation, and the importance of remaining in the ED until their evaluation is complete.     Jackelyn Hoehn, PA-C 07/10/22 1526

## 2022-07-10 NOTE — ED Provider Notes (Signed)
[facility] Provider Note   Event Date/Time   First MD Initiated Contact with Patient 07/10/22 1623     (approximate) History  Dizziness  HPI Richard Cervantes is a 89 y.o. male with a recent past medical history of sinus infection who presents complaining of dizziness with a fall today.  Patient states that he has had positional room spinning since awakening this morning.  Patient denies any symptoms similar to this in the past.  Patient states when he got out of the car to go on a walk at the park today he felt as if the room and ground was spinning and fell over to the side without any head trauma.  Patient states that he was unable to get up after this fall secondary to dizziness.  Patient states that symptoms have resolved at this time and denies any complaints ROS: Patient currently denies any headache, vision changes, tinnitus, difficulty speaking, facial droop, sore throat, chest pain, shortness of breath, abdominal pain, nausea/vomiting/diarrhea, dysuria, or weakness/numbness/paresthesias in any extremity   Physical Exam  Triage Vital Signs: ED Triage Vitals  Enc Vitals Group     BP 07/10/22 1523 127/70     Pulse Rate 07/10/22 1523 64     Resp 07/10/22 1523 16     Temp 07/10/22 1523 98.3 F (36.8 C)     Temp src --      SpO2 07/10/22 1523 97 %     Weight 07/10/22 1524 185 lb (83.9 kg)     Height 07/10/22 1524 5\' 9"  (1.753 m)     Head Circumference --      Peak Flow --      Pain Score 07/10/22 1524 0     Pain Loc --      Pain Edu? --      Excl. in GC? --    Most recent vital signs: Vitals:   07/10/22 1523 07/10/22 1743  BP: 127/70 123/73  Pulse: 64 63  Resp: 16 17  Temp: 98.3 F (36.8 C) 98.4 F (36.9 C)  SpO2: 97% 100%   General: Awake, oriented x4. CV:  Good peripheral perfusion.  Resp:  Normal effort.  Abd:  No distention.  Other:  Elderly, overweight African-American male laying in bed in no acute distress.  Hinnts exam negative ED  Results / Procedures / Treatments  Labs (all labs ordered are listed, but only abnormal results are displayed) Labs Reviewed  BASIC METABOLIC PANEL - Abnormal; Notable for the following components:      Result Value   Glucose, Bld 106 (*)    BUN 37 (*)    Creatinine, Ser 1.35 (*)    Calcium 8.4 (*)    GFR, Estimated 50 (*)    All other components within normal limits  CBC WITH DIFFERENTIAL/PLATELET - Abnormal; Notable for the following components:   RBC 3.55 (*)    Hemoglobin 11.4 (*)    HCT 34.5 (*)    Abs Immature Granulocytes 0.11 (*)    All other components within normal limits  URINALYSIS, ROUTINE W REFLEX MICROSCOPIC - Abnormal; Notable for the following components:   Color, Urine YELLOW (*)    APPearance CLEAR (*)    All other components within normal limits  TROPONIN I (HIGH SENSITIVITY)   EKG ED ECG REPORT I, Merwyn Katos, the attending physician, personally viewed and interpreted this ECG. Date: 07/10/2022 EKG Time: 1526 Rate: 71 Rhythm: normal sinus rhythm QRS Axis: normal Intervals: normal ST/T Wave abnormalities:  normal Narrative Interpretation: no evidence of acute ischemia RADIOLOGY ED MD interpretation: CT of the head without contrast interpreted by me shows no evidence of acute abnormalities including no intracerebral hemorrhage, obvious masses, or significant edema  2 view chest x-ray interpreted by me shows no evidence of acute abnormalities including no pneumonia, pneumothorax, or widened mediastinum -Agree with radiology assessment Official radiology report(s): CT Head Wo Contrast  Result Date: 07/10/2022 CLINICAL DATA:  Dizziness EXAM: CT HEAD WITHOUT CONTRAST TECHNIQUE: Contiguous axial images were obtained from the base of the skull through the vertex without intravenous contrast. RADIATION DOSE REDUCTION: This exam was performed according to the departmental dose-optimization program which includes automated exposure control, adjustment of the mA  and/or kV according to patient size and/or use of iterative reconstruction technique. COMPARISON:  CT 01/27/2014 FINDINGS: Brain: No acute territorial infarction, hemorrhage or intracranial mass. Ventricles are nonenlarged. Patchy white matter hypodensity consistent with chronic small vessel ischemic change. Mild atrophy Vascular: No hyperdense vessels.  Carotid vascular calcification Skull: Normal. Negative for fracture or focal lesion. Sinuses/Orbits: No acute finding. Other: None. IMPRESSION: 1. No CT evidence for acute intracranial abnormality. 2. Atrophy and chronic small vessel ischemic changes of the white matter. Electronically Signed   By: Jasmine Pang M.D.   On: 07/10/2022 15:59   DG Chest 2 View  Result Date: 07/10/2022 CLINICAL DATA:  Dizziness. EXAM: CHEST - 2 VIEW COMPARISON:  January 22, 2021. FINDINGS: The heart size and mediastinal contours are within normal limits. Both lungs are clear. The visualized skeletal structures are unremarkable. IMPRESSION: No active cardiopulmonary disease. Electronically Signed   By: Lupita Raider M.D.   On: 07/10/2022 15:54   PROCEDURES: Critical Care performed: No .1-3 Lead EKG Interpretation  Performed by: Merwyn Katos, MD Authorized by: Merwyn Katos, MD     Interpretation: normal     ECG rate:  71   ECG rate assessment: normal     Rhythm: sinus rhythm     Ectopy: none     Conduction: normal    MEDICATIONS ORDERED IN ED: Medications - No data to display IMPRESSION / MDM / ASSESSMENT AND PLAN / ED COURSE  I reviewed the triage vital signs and the nursing notes.                             Patient's presentation is most consistent with acute presentation with potential threat to life or bodily function. Based on History, Exam, and Findings, presentation not consistent with syncope, seizure, stroke, meningitis, symptomatic anemia (gastrointestinal bleed), Increased ICP (cerebral tumor/mass), ICH. Additionally, I have a low suspicion for  AOM, labyrinthitis, or other infectious process.  Reassessment: Prior to discharge symptoms controlled, patient well appearing. Disposition:  Discharge. Strict return precautions discussed w/ full understanding. Advise follow up with primary care provider within 24-48 hours.   FINAL CLINICAL IMPRESSION(S) / ED DIAGNOSES   Final diagnoses:  Vertigo  Fall, initial encounter   Rx / DC Orders   ED Discharge Orders          Ordered    meclizine (ANTIVERT) 25 MG tablet  3 times daily PRN        07/10/22 1726           Note:  This document was prepared using Dragon voice recognition software and may include unintentional dictation errors.   Merwyn Katos, MD 07/10/22 [phone]

## 2022-07-10 NOTE — ED Notes (Signed)
Difficult stick, lab contacted.

## 2022-07-10 NOTE — ED Triage Notes (Signed)
Pt to ED for dizziness started this am with moving head. Fall today, denies hitting head. Also reports problems with sinuses.

## 2022-10-01 ENCOUNTER — Other Ambulatory Visit: Payer: Self-pay | Admitting: *Deleted

## 2022-10-01 ENCOUNTER — Other Ambulatory Visit: Payer: Medicare Other

## 2022-10-01 DIAGNOSIS — R972 Elevated prostate specific antigen [PSA]: Secondary | ICD-10-CM

## 2022-10-04 ENCOUNTER — Telehealth: Payer: Self-pay

## 2022-10-04 ENCOUNTER — Ambulatory Visit: Payer: Medicare Other | Admitting: Urology

## 2022-10-04 ENCOUNTER — Encounter: Payer: Self-pay | Admitting: Urology

## 2022-10-04 VITALS — BP 158/66 | HR 86 | Ht 69.0 in | Wt 180.0 lb

## 2022-10-04 DIAGNOSIS — R399 Unspecified symptoms and signs involving the genitourinary system: Secondary | ICD-10-CM | POA: Diagnosis not present

## 2022-10-04 DIAGNOSIS — C61 Malignant neoplasm of prostate: Secondary | ICD-10-CM | POA: Diagnosis not present

## 2022-10-04 DIAGNOSIS — R350 Frequency of micturition: Secondary | ICD-10-CM | POA: Diagnosis not present

## 2022-10-04 DIAGNOSIS — N3941 Urge incontinence: Secondary | ICD-10-CM

## 2022-10-04 MED ORDER — SILODOSIN 4 MG PO CAPS: 4.0000 mg | ORAL_CAPSULE | Freq: Every day | ORAL | 0 refills | Status: DC

## 2022-10-04 NOTE — Telephone Encounter (Signed)
-----   Message from [facility] Preston Q sent at 10/04/2022 12:13 PM EDT ----- Dr. Lonna Cobb want to start eligard on patient . Thanks

## 2022-10-04 NOTE — Progress Notes (Signed)
I, Maysun Anabel Bene, acting as a scribe for Riki Altes, MD., have documented all relevant documentation on the behalf of Riki Altes, MD, as directed by Riki Altes, MD while in the presence of Riki Altes, MD.  10/04/2022 12:32 PM   Richard Cervantes 03-15-1931 [id number]  Referring provider: [facility] [address] [place],  [place] [address]  Chief Complaint  Patient presents with   Elevated PSA   Urologic history: 1. Prostate cancer Clinical stage T3 Gleason 4+3, adenocarcinoma.  Prostate biopsy 06/14/22 with 47 gram prostate and abnormally firm right prostate on DRE  Sextant biopsies performed 6/6 core is positive Gleason 4+3 adenocarcinoma involving 100% of submitted tissue. PSMA/PET 07/03/22 showed no evidence of metastatic disease with possible early extension into the base of the seminal vesicles.  After discussing options, he elected surveillance. He is active and was concerned about side effects of ADT.  HPI: Richard Cervantes is a 89 y.o. male presents for follow up of prostate cancer.  He presents today with his daughter  Does have bothersome frequency urgency with urge incontinence.  No dysuria or gross hematuria.  PSA 10/01/22 has increased to 35.7   PSA trend   Prostate Specific Ag, Serum  Latest Ref Rng 0.0 - 4.0 ng/mL  12/05/2021 16.8 (H)   12/21/2021 19.6 (H)   02/13/2022 18.2 (H)   04/24/2022 29.4 (H)   10/01/2022 35.7 (H)      PMH: Past Medical History:  Diagnosis Date   Arthritis    knees   COVID-19 03/07/2020   Gout    Hypertension     Surgical History: Past Surgical History:  Procedure Laterality Date   CATARACT EXTRACTION W/PHACO Right 03/22/2020   Procedure: CATARACT EXTRACTION PHACO AND INTRAOCULAR LENS PLACEMENT (IOC) RIGHT 13.10 01:35.8 13.7%;  Surgeon: Lockie Mola, MD;  Location: [facility];  Service: Ophthalmology;  Laterality: Right;  COVID + 03-07-20   CATARACT EXTRACTION W/PHACO Left  05/03/2020   Procedure: CATARACT EXTRACTION PHACO AND INTRAOCULAR LENS PLACEMENT (IOC) LEFT 5.79 00:49.8 11.6%;  Surgeon: Lockie Mola, MD;  Location: [facility];  Service: Ophthalmology;  Laterality: Left;  COVID + 03-07-20   CHOLECYSTECTOMY     HERNIA REPAIR     KIDNEY STONE SURGERY      Home Medications:  Allergies as of 10/04/2022       Reactions   Lactose    Lactose Intolerance (gi)         Medication List        Accurate as of October 04, 2022 12:32 PM. If you have any questions, ask your nurse or doctor.          allopurinol 100 MG tablet Commonly known as: ZYLOPRIM Take 0.5 tablets (50 mg total) by mouth daily.   amLODipine 10 MG tablet Commonly known as: NORVASC Take 10 mg by mouth daily.   ASPIRIN 81 PO Take by mouth daily.   atorvastatin 80 MG tablet Commonly known as: LIPITOR Take 80 mg by mouth daily.   carvedilol 6.25 MG tablet Commonly known as: COREG Take 6.25 mg by mouth 2 (two) times daily with a meal.   colchicine 0.6 MG tablet Take 0.5 tablets (0.3 mg total) by mouth daily.   enalapril 2.5 MG tablet Commonly known as: VASOTEC Take 2.5 mg by mouth daily.   feeding supplement Liqd Take 237 mLs by mouth 2 (two) times daily between meals.   fluticasone 50 MCG/ACT nasal spray Commonly known as:  FLONASE Place into both nostrils daily.   meclizine 25 MG tablet Commonly known as: ANTIVERT Take 1 tablet (25 mg total) by mouth 3 (three) times daily as needed for dizziness.   silodosin 4 MG Caps capsule Commonly known as: RAPAFLO Take 1 capsule (4 mg total) by mouth daily with breakfast. Started by: Riki Altes        Allergies:  Allergies  Allergen Reactions   Lactose    Lactose Intolerance (Gi)     Social History:  reports that he has never smoked. He has never used smokeless tobacco. He reports that he does not drink alcohol and does not use drugs.   Physical Exam: BP (!) 158/66   Pulse 86   Ht 5\' 9"   (1.753 m)   Wt 180 lb (81.6 kg)   BMI 26.58 kg/m   Constitutional:  Alert and oriented, No acute distress. HEENT: [place] AT Respiratory: Normal respiratory effort, no increased work of breathing. Psychiatric: Normal mood and affect.   Assessment & Plan:    1. T3 high risk adenocarcinoma of the prostate Rising PSA Discussed hormonal therapy and he desires to start leuprolide. He will return for an injection once prior authorization obtained.  PSA 3 months after first injection   2. Lower urinary tract symptoms with urge incontinence Worsening frequency, urgency, incontinence Trial silodosin 4 mg daily  I have reviewed the above documentation for accuracy and completeness, and I agree with the above.   Riki Altes, MD  [facility] [address], [address] [place], [place] [address] [phone]

## 2022-10-04 NOTE — Telephone Encounter (Signed)
Prior authorization approved via [facility] portal.  Auth # [id number]  Dates: 10/04/22 - 10/04/23  Pt scheduled.

## 2022-10-14 ENCOUNTER — Ambulatory Visit: Payer: Medicare Other | Admitting: Physician Assistant

## 2022-10-14 ENCOUNTER — Encounter: Payer: Self-pay | Admitting: Physician Assistant

## 2022-10-14 VITALS — BP 94/53 | HR 85 | Wt 195.8 lb

## 2022-10-14 DIAGNOSIS — C61 Malignant neoplasm of prostate: Secondary | ICD-10-CM

## 2022-10-14 MED ORDER — LEUPROLIDE ACETATE (6 MONTH) 45 MG ~~LOC~~ KIT
45.0000 mg | PACK | Freq: Once | SUBCUTANEOUS | Status: AC
Start: 2022-10-14 — End: 2022-10-14
  Administered 2022-10-14: 45 mg via SUBCUTANEOUS

## 2022-10-14 NOTE — Patient Instructions (Signed)
Please take the following dietary supplements for the duration of your hormone suppression therapy to reduce your risk for bone loss: -Calcium 1000-1200mg daily -Vitamin D 800-1000IU daily  

## 2022-10-14 NOTE — Progress Notes (Signed)
Patient presented to clinic today for initiation of ADT with Eligard per Dr. Lonna Cobb. Intended duration unclear; I sent Dr. Lonna Cobb a message and will reach out to the patient when I learn more.   Patient reports he does not stay active due to bad knees. He ambulates with a cane.  He has a history of CAD, HTN, CKD.  We discussed the anticipated side effects of ADT today including hot flashes, decreased libido, erectile dysfunction, breast tenderness or size changes, fatigue, weight gain, bone loss, muscle mass loss, brain fog, increased cholesterol, increased blood pressure, increased blood sugar, and increased risk for stroke and heart attack.  I counseled him to maintain a healthy diet and consider weightbearing exercise to mitigate bone and muscle loss for the duration of therapy.  Additionally, I counseled him to start daily calcium 1000-1200mg  and vitamin D 800-1000IU supplements to reduce bone loss.  Lastly, I encouraged him to maintain regular follow-ups with his PCP for monitoring of his chronic health conditions. He expressed understanding, all questions answered. Printed resources on ADT provided today.  Carman Ching, PA-C 10/14/22 12:53 PM  I spent 15 minutes on the day of the encounter to include pre-visit record review, face-to-face time with the patient, and post-visit ordering of tests.

## 2022-10-14 NOTE — Progress Notes (Signed)
Eligard SubQ Injection   Due to Prostate Cancer patient is present today for a Eligard Injection.  Medication: Eligard 6 month Dose: 45 mg  Location: right  Lot: [id number] Exp: 02/2024  Patient tolerated well, no complications were noted  Performed by: Cira Rue L RMA

## 2022-10-30 ENCOUNTER — Other Ambulatory Visit: Payer: Self-pay | Admitting: Urology

## 2023-01-14 ENCOUNTER — Other Ambulatory Visit: Payer: Self-pay | Admitting: *Deleted

## 2023-01-14 DIAGNOSIS — R972 Elevated prostate specific antigen [PSA]: Secondary | ICD-10-CM

## 2023-01-15 ENCOUNTER — Other Ambulatory Visit: Payer: Medicare Other

## 2023-01-15 DIAGNOSIS — R972 Elevated prostate specific antigen [PSA]: Secondary | ICD-10-CM

## 2023-01-16 LAB — PSA: Prostate Specific Ag, Serum: 0.3 ng/mL (ref 0.0–4.0)

## 2023-01-22 ENCOUNTER — Other Ambulatory Visit: Payer: Self-pay

## 2023-01-22 DIAGNOSIS — R972 Elevated prostate specific antigen [PSA]: Secondary | ICD-10-CM

## 2023-03-27 ENCOUNTER — Telehealth: Payer: Self-pay | Admitting: Pharmacy Technician

## 2023-03-27 NOTE — Telephone Encounter (Signed)
 Auth Submission: APPROVED - UPDATED TAX ID [id number] Site of care: [facility] Payer: [facility] MEDICARE Medication & CPT/J Code(s) submitted:  Route of submission (phone, fax, portal):  Phone # [phone] Fax # Auth type: Buy/Bill PB Units/visits requested: 45MG  Reference number: [id number] Approval from: 03/27/23 to 03/26/24

## 2023-04-03 ENCOUNTER — Ambulatory Visit: Payer: Self-pay | Admitting: Urology

## 2023-04-04 ENCOUNTER — Encounter: Payer: Self-pay | Admitting: Urology

## 2023-04-23 ENCOUNTER — Other Ambulatory Visit: Payer: Self-pay | Admitting: Urology

## 2023-05-14 ENCOUNTER — Encounter: Payer: Self-pay | Admitting: Urology

## 2023-05-14 ENCOUNTER — Ambulatory Visit: Payer: Medicare Other | Admitting: Urology

## 2023-05-14 VITALS — BP 170/73 | HR 80 | Ht 69.0 in | Wt 200.0 lb

## 2023-05-14 DIAGNOSIS — R399 Unspecified symptoms and signs involving the genitourinary system: Secondary | ICD-10-CM | POA: Diagnosis not present

## 2023-05-14 DIAGNOSIS — C61 Malignant neoplasm of prostate: Secondary | ICD-10-CM

## 2023-05-14 DIAGNOSIS — R972 Elevated prostate specific antigen [PSA]: Secondary | ICD-10-CM

## 2023-05-14 MED ORDER — LEUPROLIDE ACETATE (6 MONTH) 45 MG ~~LOC~~ KIT
45.0000 mg | PACK | Freq: Once | SUBCUTANEOUS | Status: AC
Start: 2023-05-14 — End: 2023-05-14
  Administered 2023-05-14: 45 mg via SUBCUTANEOUS

## 2023-05-14 NOTE — Progress Notes (Signed)
 I, Maysun Anabel Bene, acting as a scribe for Riki Altes, MD., have documented all relevant documentation on the behalf of Riki Altes, MD, as directed by Riki Altes, MD while in the presence of Riki Altes, MD.  05/14/2023 2:03 PM   Richard Cervantes 30-Nov-1931 [id number]  Referring provider: [facility] [address] [place],  [place] [address]  Chief Complaint  Patient presents with   Elevated PSA   Urologic history: 1. Prostate cancer Clinical stage T3 Gleason 4+3, adenocarcinoma.  Prostate biopsy 06/14/22 with 47 gram prostate and abnormally firm right prostate on DRE  Sextant biopsies performed 6/6 core is positive Gleason 4+3 adenocarcinoma involving 100% of submitted tissue. PSMA/PET 07/03/22 showed no evidence of metastatic disease with possible early extension into the base of the seminal vesicles. Elected to start ADT August 2024 with PSA rise to 35  HPI: Richard Cervantes is a 89 y.o. male presents for a 6 month follow-up.  Most significant side effect of Leuprolide has been hot flashes, which he has tolerated well.  Stable lower urinary tract symptoms Denies dysuria or gross hematuria.  PSA 01/15/23 was 0.3  PSA trend  Prostate Specific Ag, Serum  Latest Ref Rng 0.0 - 4.0 ng/mL  12/05/2021 16.8 (H)   12/21/2021 19.6 (H)   02/13/2022 18.2 (H)   04/24/2022 29.4 (H)   10/01/2022 35.7 (H)   01/15/2023 0.3      PMH: Past Medical History:  Diagnosis Date   Arthritis    knees   COVID-19 03/07/2020   Gout    Hypertension     Surgical History: Past Surgical History:  Procedure Laterality Date   CATARACT EXTRACTION W/PHACO Right 03/22/2020   Procedure: CATARACT EXTRACTION PHACO AND INTRAOCULAR LENS PLACEMENT (IOC) RIGHT 13.10 01:35.8 13.7%;  Surgeon: Lockie Mola, MD;  Location: [facility];  Service: Ophthalmology;  Laterality: Right;  COVID + 03-07-20   CATARACT EXTRACTION W/PHACO Left 05/03/2020   Procedure: CATARACT  EXTRACTION PHACO AND INTRAOCULAR LENS PLACEMENT (IOC) LEFT 5.79 00:49.8 11.6%;  Surgeon: Lockie Mola, MD;  Location: [facility];  Service: Ophthalmology;  Laterality: Left;  COVID + 03-07-20   CHOLECYSTECTOMY     HERNIA REPAIR     KIDNEY STONE SURGERY      Home Medications:  Allergies as of 05/14/2023       Reactions   Lactose    Lactose Intolerance (gi)         Medication List        Accurate as of May 14, 2023  2:03 PM. If you have any questions, ask your nurse or doctor.          allopurinol 100 MG tablet Commonly known as: ZYLOPRIM Take 0.5 tablets (50 mg total) by mouth daily.   amLODipine 10 MG tablet Commonly known as: NORVASC Take 10 mg by mouth daily.   ASPIRIN 81 PO Take by mouth daily.   atorvastatin 80 MG tablet Commonly known as: LIPITOR Take 80 mg by mouth daily.   carvedilol 6.25 MG tablet Commonly known as: COREG Take 6.25 mg by mouth 2 (two) times daily with a meal.   colchicine 0.6 MG tablet Take 0.5 tablets (0.3 mg total) by mouth daily.   enalapril 2.5 MG tablet Commonly known as: VASOTEC Take 2.5 mg by mouth daily.   feeding supplement Liqd Take 237 mLs by mouth 2 (two) times daily between meals.   fluticasone 50 MCG/ACT nasal spray Commonly known as: FLONASE Place  into both nostrils daily.   meclizine 25 MG tablet Commonly known as: ANTIVERT Take 1 tablet (25 mg total) by mouth 3 (three) times daily as needed for dizziness.   silodosin 4 MG Caps capsule Commonly known as: RAPAFLO TAKE 1 CAPSULE (4 MG TOTAL) BY MOUTH DAILY WITH BREAKFAST.        Allergies:  Allergies  Allergen Reactions   Lactose    Lactose Intolerance (Gi)     Social History:  reports that he has never smoked. He has never used smokeless tobacco. He reports that he does not drink alcohol and does not use drugs.   Physical Exam: BP (!) 170/73   Pulse 80   Ht 5\' 9"  (1.753 m)   Wt 200 lb (90.7 kg)   BMI 29.53 kg/m    Constitutional:  Alert and oriented, No acute distress. HEENT: [place] AT, moist mucus membranes.  Trachea midline, no masses. Cardiovascular: No clubbing, cyanosis, or edema. Respiratory: Normal respiratory effort, no increased work of breathing. GI: Abdomen is soft, nontender, nondistended, no abdominal masses Skin: No rashes, bruises or suspicious lesions. Neurologic: Grossly intact, no focal deficits, moving all 4 extremities. Psychiatric: Normal mood and affect.   Assessment & Plan:    1. T3 high-risk prostate cancer Leuprolide given today PSA/testosterone level drawn Follow up 6 months with PSA prior.  2. Lower urinary tract symptoms Stable  [facility] [address] [place], [place] [address] [phone]

## 2023-05-14 NOTE — Progress Notes (Unsigned)
 Eligard SubQ Injection   Due to Prostate Cancer patient is present today for a Eligard Injection.  Medication: Eligard 6  month Dose: 45 mg  Location: right  Lot: [id number] Exp: 10/12/2024  Patient tolerated well, no complications were noted  Performed by: Ples Specter CMA  Per Dr. Lonna Cobb patient is to continue therapy for 6 months . Patient's next follow up was scheduled for 11/13/2023. This appointment was scheduled using wheel and given to patient today along with reminder continue on Vitamin D 800-1000iu and Calcium 1000-1200mg  daily while on Androgen Deprivation Therapy.  PA approval dates:

## 2023-05-15 LAB — TESTOSTERONE: Testosterone: <3 ng/dL — ABNORMAL LOW (ref 264–916)

## 2023-05-15 LAB — PSA: Prostate Specific Ag, Serum: 0.1 ng/mL (ref 0.0–4.0)

## 2023-10-16 ENCOUNTER — Other Ambulatory Visit: Payer: Self-pay | Admitting: Urology

## 2023-11-12 ENCOUNTER — Other Ambulatory Visit

## 2023-11-13 ENCOUNTER — Other Ambulatory Visit

## 2023-11-13 DIAGNOSIS — C61 Malignant neoplasm of prostate: Secondary | ICD-10-CM

## 2023-11-14 ENCOUNTER — Ambulatory Visit: Admitting: Physician Assistant

## 2023-11-14 LAB — PSA: Prostate Specific Ag, Serum: 0.3 ng/mL (ref 0.0–4.0)

## 2023-11-17 ENCOUNTER — Ambulatory Visit: Admitting: Physician Assistant

## 2023-11-17 ENCOUNTER — Encounter: Payer: Self-pay | Admitting: Physician Assistant

## 2023-11-17 VITALS — BP 138/71 | HR 108 | Ht 69.0 in | Wt 211.3 lb

## 2023-11-17 DIAGNOSIS — C61 Malignant neoplasm of prostate: Secondary | ICD-10-CM

## 2023-11-17 MED ORDER — LEUPROLIDE ACETATE (6 MONTH) 45 MG ~~LOC~~ KIT
45.0000 mg | PACK | Freq: Once | SUBCUTANEOUS | Status: AC
Start: 2023-11-17 — End: 2023-11-17
  Administered 2023-11-17: 45 mg via SUBCUTANEOUS

## 2023-11-17 NOTE — Progress Notes (Signed)
 Eligard  SubQ Injection   Due to Prostate Cancer patient is present today for a Eligard  Injection.  Medication: Eligard  6 month Dose: 45 mg  Location: left  Lot: [id number] Exp: [id number]  Patient tolerated well, no complications were noted  Performed by: Myrtha Rubinstein, CMA  Per Dr. Twylla patient is to continue therapy for 2-3 years (initiated August 2024). Patient's next follow up was scheduled for 05/21/2024. This appointment was scheduled using wheel and given to patient today along with reminder continue on Vitamin D 800-1000iu and Calcium 1000-1200mg  daily while on Androgen Deprivation Therapy.  PA approval dates:

## 2023-11-17 NOTE — Patient Instructions (Signed)
 Please take the following dietary supplements for the duration of your hormone suppression therapy to reduce your risk for bone loss: -Calcium 1000-1200mg  daily -Vitamin D 800-1000IU daily

## 2023-11-25 ENCOUNTER — Emergency Department
Admission: EM | Admit: 2023-11-25 | Discharge: 2023-11-25 | Disposition: A | Discharge status: Home / Self Care | Attending: Emergency Medicine | Admitting: Emergency Medicine

## 2023-11-25 ENCOUNTER — Emergency Department

## 2023-11-25 ENCOUNTER — Other Ambulatory Visit: Payer: Self-pay

## 2023-11-25 DIAGNOSIS — M25562 Pain in left knee: Secondary | ICD-10-CM | POA: Diagnosis present

## 2023-11-25 DIAGNOSIS — W1839XA Other fall on same level, initial encounter: Secondary | ICD-10-CM | POA: Diagnosis not present

## 2023-11-25 MED ORDER — LIDOCAINE 5 % EX PTCH
1.0000 | MEDICATED_PATCH | CUTANEOUS | Status: DC
  Administered 2023-11-25: 1 via TRANSDERMAL
  Filled 2023-11-25: qty 1

## 2023-11-25 MED ORDER — LIDOCAINE 5 % EX PTCH: 1.0000 | MEDICATED_PATCH | Freq: Two times a day (BID) | CUTANEOUS | 0 refills | Status: AC

## 2023-11-25 NOTE — ED Notes (Signed)
 See triage note  Presents with pain to left knee  States fell couple of weeks ago  Denies any recent fall

## 2023-11-25 NOTE — ED Provider Notes (Signed)
 [facility] Provider Note    Event Date/Time   First MD Initiated Contact with Patient 11/25/23 1150     (approximate)   History   Knee Pain   HPI  Richard Cervantes is a 89 y.o. male who presents today for evaluation of left knee pain.  Patient reports that he had a trip and fall last week and he has continued to have pain in his left knee.  Patient reports that he still able to ambulate, though has pain with ambulation.  He did not have any head strike or LOC at the time of the accident.  He has not had any nausea or vomiting.  No numbness or tingling.  Patient Active Problem List   Diagnosis Date Noted   Renal failure 04/02/2018          Physical Exam   Triage Vital Signs: ED Triage Vitals  Encounter Vitals Group     BP 11/25/23 1100 (!) 167/66     Girls Systolic BP Percentile --      Girls Diastolic BP Percentile --      Boys Systolic BP Percentile --      Boys Diastolic BP Percentile --      Pulse Rate 11/25/23 1100 98     Resp 11/25/23 1100 18     Temp 11/25/23 1100 98.2 F (36.8 C)     Temp Source 11/25/23 1100 Oral     SpO2 11/25/23 1100 97 %     Weight --      Height --      Head Circumference --      Peak Flow --      Pain Score 11/25/23 1059 10     Pain Loc --      Pain Education --      Exclude from Growth Chart --     Most recent vital signs: Vitals:   11/25/23 1100  BP: (!) 167/66  Pulse: 98  Resp: 18  Temp: 98.2 F (36.8 C)  SpO2: 97%    Physical Exam Vitals and nursing note reviewed.  Constitutional:      General: Awake and alert. No acute distress.    Appearance: Normal appearance. The patient is normal weight.  HENT:     Head: Normocephalic and atraumatic.     Mouth: Mucous membranes are moist.  Eyes:     General: PERRL. Normal EOMs        Right eye: No discharge.        Left eye: No discharge.     Conjunctiva/sclera: Conjunctivae normal.  Cardiovascular:     Rate and Rhythm: Normal rate and  regular rhythm.     Pulses: Normal pulses.  No JVD Pulmonary:     Effort: Pulmonary effort is normal. No respiratory distress.     Breath sounds: Normal breath sounds.  Abdominal:     Abdomen is soft. There is no abdominal tenderness. No rebound or guarding. No distention. Musculoskeletal:        General: No swelling. Normal range of motion.     Cervical back: Normal range of motion and neck supple.  Bilateral lower extremity edema, baseline per patient and family. Left knee: No deformity or rash.  Mild anterior joint line tenderness. No patellar tenderness, no ballotment Warm and well perfused extremity with 2+ pedal pulses 5/5 strength to dorsiflexion and plantarflexion at the ankle with intact sensation throughout extremity Able to actively and passively range his knee, though minimally secondary to  pain.  Extensor mechanism intact, able to lift leg up off of stretcher. No ligamentous laxity. Negative anterior/posterior drawer/negative lachman, negative mcmurrays No effusion or warmth Intact quadriceps, hamstring function, patellar tendon function Pelvis stable Full ROM of ankle without pain or swelling Foot warm and well perfused Skin:    General: Skin is warm and dry.     Capillary Refill: Capillary refill takes less than 2 seconds.     Findings: No rash.  Neurological:     Mental Status: The patient is awake and alert.      ED Results / Procedures / Treatments   Labs (all labs ordered are listed, but only abnormal results are displayed) Labs Reviewed - No data to display   EKG     RADIOLOGY I independently reviewed and interpreted imaging and agree with radiologists findings.     PROCEDURES:  Critical Care performed:   Procedures   MEDICATIONS ORDERED IN ED: Medications  lidocaine  (LIDODERM ) 5 % 1 patch (1 patch Transdermal Patch Applied 11/25/23 1330)     IMPRESSION / MDM / ASSESSMENT AND PLAN / ED COURSE  I reviewed the triage vital signs and the  nursing notes.   Differential diagnosis includes, but is not limited to, contusion, fracture, osteoarthritis.  Patient is awake and alert, he has normal 2+ pedal pulses and is able to range his knee passively and actively.  There is no warmth or erythema.  No fever or constitutional symptoms.  Do not suspect septic joint.  Patient has bilateral edema which is at his baseline per her family, no calf tenderness, no history of PE or DVT, I do not suspect DVT today.  No shortness of breath, orthopnea, or dyspnea on exertion, no JVD, do not suspect volume overload as a source of his swelling today  X-ray obtained reveals severe degenerative changes per my independent interpretation.  No acute bony injury.  I recommended outpatient follow-up with orthopedics and the appropriate follow-up information was provided.  He was given Lidoderm  patch with good effect and requested a prescription for these.  We discussed return precautions in the meantime.  Patient understands and agrees with plan.  He was discharged in stable condition.   Patient's presentation is most consistent with acute complicated illness / injury requiring diagnostic workup.    FINAL CLINICAL IMPRESSION(S) / ED DIAGNOSES   Final diagnoses:  Acute pain of left knee     Rx / DC Orders   ED Discharge Orders          Ordered    lidocaine  (LIDODERM ) 5 %  Every 12 hours        11/25/23 1317             Note:  This document was prepared using Dragon voice recognition software and may include unintentional dictation errors.   Maycol Hoying E, PA-C 11/25/23 1339    Arlander Charleston, MD 11/25/23 1414

## 2023-11-25 NOTE — ED Triage Notes (Signed)
 Patient states he fell 2 weeks ago, having pain to left knee.

## 2023-11-25 NOTE — Discharge Instructions (Addendum)
 Please follow-up with orthopedics.  Please return for any new, worsening, or changing symptoms or other concerns.  It was a pleasure caring for you today.

## 2023-12-26 ENCOUNTER — Ambulatory Visit: Attending: Cardiology | Admitting: Cardiology

## 2023-12-26 ENCOUNTER — Encounter: Payer: Self-pay | Admitting: Cardiology

## 2023-12-26 VITALS — BP 128/60 | HR 92 | Ht 69.0 in | Wt 200.0 lb

## 2023-12-26 DIAGNOSIS — I10 Essential (primary) hypertension: Secondary | ICD-10-CM

## 2023-12-26 DIAGNOSIS — R011 Cardiac murmur, unspecified: Secondary | ICD-10-CM

## 2023-12-26 DIAGNOSIS — R600 Localized edema: Secondary | ICD-10-CM | POA: Diagnosis not present

## 2023-12-26 DIAGNOSIS — I4892 Unspecified atrial flutter: Secondary | ICD-10-CM

## 2023-12-26 MED ORDER — FUROSEMIDE 40 MG PO TABS: 40.0000 mg | ORAL_TABLET | Freq: Every day | ORAL | 3 refills | Status: DC

## 2023-12-26 MED ORDER — AMLODIPINE BESYLATE 5 MG PO TABS: 5.0000 mg | ORAL_TABLET | Freq: Every day | ORAL | 3 refills | Status: AC

## 2023-12-26 NOTE — Patient Instructions (Signed)
 Medication Instructions:  - INCREASE lasix to 40 mg daily  - DECREASE amlodipine  to 5 mg daily   *If you need a refill on your cardiac medications before your next appointment, please call your pharmacy*  Lab Work: Your provider would like for you to return in 2 weeks to have the following labs drawn: BMP.   Please go to [facility] [address] ([facility]) [address], [place] [address] You do not need an appointment.  They are open from 8 am- 4:30 pm.  Lunch from 1:00 pm- 2:00 pm You do not need to be fasting.   You may also go to one of the following [facility]:  [address] [place], [place] [address] Phone: [phone] Lab hours: Mon-Fri 8 am- 5 pm    Lunch 12 pm- 1 pm  [address] [place],  [place]  [address]  [place]  Phone: [phone] Lab hours: 7 am- 4 pm Lunch 12 pm-1 pm   [address] [place],  [place]  [address]  [place]  Phone: [phone] Lab hours: Mon-Fri 8 am- 5 pm    Lunch 12 pm- 1 pm  If you have labs (blood work) drawn today and your tests are completely normal, you will receive your results only by: MyChart Message (if you have MyChart) OR A paper copy in the mail If you have any lab test that is abnormal or we need to change your treatment, we will call you to review the results.  Testing/Procedures: Your physician has requested that you have an echocardiogram. Echocardiography is a painless test that uses sound waves to create images of your heart. It provides your doctor with information about the size and shape of your heart and how well your heart's chambers and valves are working.   You may receive an ultrasound enhancing agent through an IV if needed to better visualize your heart during the echo. This procedure takes approximately one hour.  There are no restrictions for this procedure.  This will take place at [address] [facility]) [address], [place] [address]  Please note: We ask at that you not bring children  with you during ultrasound (echo/ vascular) testing. Due to room size and safety concerns, children are not allowed in the ultrasound rooms during exams. Our front office staff cannot provide observation of children in our lobby area while testing is being conducted. An adult accompanying a patient to their appointment will only be allowed in the ultrasound room at the discretion of the ultrasound technician under special circumstances. We apologize for any inconvenience.   Follow-Up: At [facility], you and your health needs are our priority.  As part of our continuing mission to provide you with exceptional heart care, our providers are all part of one team.  This team includes your primary Cardiologist (physician) and Advanced Practice Providers or APPs (Physician Assistants and Nurse Practitioners) who all work together to provide you with the care you need, when you need it.  Your next appointment:   3 month(s)  Provider:   You may see Dr Darliss or one of the following Advanced Practice Providers on your designated Care Team:   Lonni Meager, NP Lesley Maffucci, PA-C Bernardino Bring, PA-C Cadence [place], PA-C Tylene Lunch, NP Barnie Hila, NP    We recommend signing up for the patient portal called MyChart.  Sign up information is provided on this After Visit Summary.  MyChart is used to connect with patients for Virtual Visits (Telemedicine).  Patients  are able to view lab/test results, encounter notes, upcoming appointments, etc.  Non-urgent messages can be sent to your provider as well.   To learn more about what you can do with MyChart, go to [email or web address].

## 2023-12-26 NOTE — Progress Notes (Signed)
 Cardiology Office Note:    Date:  12/26/2023   ID:  Richard Cervantes, DOB April 18, 1931, MRN [id number]  PCP:  Osa Geralds, NP   [facility] Providers Cardiologist:  None     Referring MD: Osa Geralds, NP   Chief Complaint  Patient presents with   New Patient (Initial Visit)    New patient  / Swelling noted to bilateral ext.  The pt has been doing well with no complaints of chest pain, chest pressure or SOB, medication reviewed verbally with patient.    History of Present Illness:    Richard Cervantes is a 89 y.o. male with a hx of hypertension, paroxysmal atrial flutter, hyperlipidemia, CKD presenting due to a cardiac murmur.  Evaluated by primary care provider last month, systolic murmur noted on exam.  He denies chest pain or shortness of breath.  Endorses leg edema over the past 3 months.  Started on Lasix 20 mg daily 2 weeks ago with minimal improvement.  Has left knee pain, considering left knee surgery in the near future.  Previously walked with a cane, fell about a month ago worsening left knee injury, currently walks with a walker.  Denies palpitations, dizziness.    Past Medical History:  Diagnosis Date   Arthritis    knees   COVID-19 03/07/2020   Gout    Hypertension     Past Surgical History:  Procedure Laterality Date   CATARACT EXTRACTION W/PHACO Right 03/22/2020   Procedure: CATARACT EXTRACTION PHACO AND INTRAOCULAR LENS PLACEMENT (IOC) RIGHT 13.10 01:35.8 13.7%;  Surgeon: Mittie Gaskin, MD;  Location: [facility];  Service: Ophthalmology;  Laterality: Right;  COVID + 03-07-20   CATARACT EXTRACTION W/PHACO Left 05/03/2020   Procedure: CATARACT EXTRACTION PHACO AND INTRAOCULAR LENS PLACEMENT (IOC) LEFT 5.79 00:49.8 11.6%;  Surgeon: Mittie Gaskin, MD;  Location[facility];  Service: Ophthalmology;  Laterality: Left;  COVID + 03-07-20   CHOLECYSTECTOMY     HERNIA REPAIR     KIDNEY STONE SURGERY      Current  Medications: Current Meds  Medication Sig   allopurinol  (ZYLOPRIM ) 100 MG tablet Take 0.5 tablets (50 mg total) by mouth daily.   ASPIRIN 81 PO Take by mouth daily.   atorvastatin (LIPITOR) 80 MG tablet Take 80 mg by mouth daily.   carvedilol  (COREG ) 6.25 MG tablet Take 6.25 mg by mouth 2 (two) times daily with a meal.   cholecalciferol (VITAMIN D3) 25 MCG (1000 UNIT) tablet Take 1,000 Units by mouth daily.   colchicine  0.6 MG tablet Take 0.5 tablets (0.3 mg total) by mouth daily.   feeding supplement, ENSURE ENLIVE, (ENSURE ENLIVE) LIQD Take 237 mLs by mouth 2 (two) times daily between meals.   HYDROcodone -acetaminophen  (NORCO/VICODIN) 5-325 MG tablet Take One tab PO Q 6 hours PRN pain   meclizine  (ANTIVERT ) 25 MG tablet Take 1 tablet (25 mg total) by mouth 3 (three) times daily as needed for dizziness.   silodosin  (RAPAFLO ) 4 MG CAPS capsule TAKE 1 CAPSULE (4 MG TOTAL) BY MOUTH DAILY WITH BREAKFAST.   [DISCONTINUED] amLODipine  (NORVASC ) 10 MG tablet Take 10 mg by mouth daily.   [DISCONTINUED] furosemide (LASIX) 20 MG tablet Take 20 mg by mouth daily.     Allergies:   Lactose and Lactose intolerance (gi)   Social History   Socioeconomic History   Marital status: Widowed    Spouse name: Not on file   Number of children: Not on file   Years of education: Not on file  Highest education level: Not on file  Occupational History   Not on file  Tobacco Use   Smoking status: Never   Smokeless tobacco: Never  Vaping Use   Vaping status: Never Used  Substance and Sexual Activity   Alcohol  use: No   Drug use: No   Sexual activity: Not on file  Other Topics Concern   Not on file  Social History Narrative   Not on file   Social Drivers of Health   Financial Resource Strain: Not on file  Food Insecurity: No Food Insecurity (12/19/2018)   Received from [facility]   Hunger Vital Sign    Within the past 12 months, you worried that your food would run out before you got the  money to buy more.: Never true    Within the past 12 months, the food you bought just didn't last and you didn't have money to get more.: Never true  Transportation Needs: Not on file  Physical Activity: Not on file  Stress: Not on file  Social Connections: Not on file     Family History: The patient's family history is not on file.  ROS:   Please see the history of present illness.     All other systems reviewed and are negative.  EKGs/Labs/Other Studies Reviewed:    The following studies were reviewed today:  EKG Interpretation Date/Time:  Friday December 26 2023 14:01:28 EST Ventricular Rate:  92 PR Interval:  218 QRS Duration:  66 QT Interval:  358 QTC Calculation: 442 R Axis:   6  Text Interpretation: Sinus rhythm with 1st degree A-V block with occasional Premature ventricular complexes Cannot rule out Inferior infarct , age undetermined Confirmed by Darliss Rogue ([id number]) on 12/26/2023 2:07:25 PM    Recent Labs: No results found for requested labs within last 365 days.  Recent Lipid Panel No results found for: CHOL, TRIG, HDL, CHOLHDL, VLDL, LDLCALC, LDLDIRECT   Risk Assessment/Calculations:             Physical Exam:    VS:  BP 128/60 (BP Location: Right Arm, Patient Position: Sitting, Cuff Size: Normal)   Pulse 92   Ht 5' 9 (1.753 m)   Wt 200 lb (90.7 kg)   SpO2 95%   BMI 29.53 kg/m     Wt Readings from Last 3 Encounters:  12/26/23 200 lb (90.7 kg)  11/25/23 211 lb 3.2 oz (95.8 kg)  11/17/23 211 lb 4.8 oz (95.8 kg)     GEN:  Well nourished, well developed in no acute distress HEENT: Normal NECK: No JVD; No carotid bruits CARDIAC: RRR, 2/6 systolic murmur RESPIRATORY:  Clear to auscultation without rales, wheezing or rhonchi  ABDOMEN: Soft, non-tender, non-distended MUSCULOSKELETAL:  2+ edema; No deformity  SKIN: Warm and dry NEUROLOGIC:  Alert and oriented x 3 PSYCHIATRIC:  Normal affect   ASSESSMENT:    1. Systolic  murmur   2. Leg edema   3. Primary hypertension   4. Atrial flutter, unspecified type (HCC)    PLAN:    In order of problems listed above:  Systolic murmur on exam, possible aortic valve disease.  Obtain echo to evaluate any significant structural abnormalities. Bilateral leg edema, increase Lasix to 40 mg daily.  Check BMP in 14 days.  Reduce Norvasc  to 5 mg daily. Hypertension, BP controlled.  Reduce Norvasc  to 5 mg daily as above, increase Lasix to 40 mg daily.  Continue Coreg  6.25 mg twice daily. Paroxysmal atrial flutter, EKG today showing  sinus rhythm.  Not on anticoagulation due to fall history and fall risk.  Continue Coreg  6.25 mg daily, aspirin 81 mg daily.  Follow-up after echocardiogram      Medication Adjustments/Labs and Tests Ordered: Current medicines are reviewed at length with the patient today.  Concerns regarding medicines are outlined above.  Orders Placed This Encounter  Procedures   Basic Metabolic Panel (BMET)   EKG 12-Lead   ECHOCARDIOGRAM COMPLETE   Meds ordered this encounter  Medications   amLODipine  (NORVASC ) 5 MG tablet    Sig: Take 1 tablet (5 mg total) by mouth daily.    Dispense:  30 tablet    Refill:  3   furosemide (LASIX) 40 MG tablet    Sig: Take 1 tablet (40 mg total) by mouth daily.    Dispense:  30 tablet    Refill:  3    Patient Instructions  Medication Instructions:  - INCREASE lasix to 40 mg daily  - DECREASE amlodipine  to 5 mg daily   *If you need a refill on your cardiac medications before your next appointment, please call your pharmacy*  Lab Work: Your provider would like for you to return in 2 weeks to have the following labs drawn: BMP.   Please go to [facility] [address] ([facility]) [address], [place] [address] You do not need an appointment.  They are open from 8 am- 4:30 pm.  Lunch from 1:00 pm- 2:00 pm You do not need to be fasting.   You may also go to one of the following  [facility]:  [address] [place], [place] [address] Phone: [phone] Lab hours: Mon-Fri 8 am- 5 pm    Lunch 12 pm- 1 pm  [address] [place],  [place]  [address]  [place]  Phone: [phone] Lab hours: 7 am- 4 pm Lunch 12 pm-1 pm   [address] [place],  [place]  [address]  [place]  Phone: [phone] Lab hours: Mon-Fri 8 am- 5 pm    Lunch 12 pm- 1 pm  If you have labs (blood work) drawn today and your tests are completely normal, you will receive your results only by: MyChart Message (if you have MyChart) OR A paper copy in the mail If you have any lab test that is abnormal or we need to change your treatment, we will call you to review the results.  Testing/Procedures: Your physician has requested that you have an echocardiogram. Echocardiography is a painless test that uses sound waves to create images of your heart. It provides your doctor with information about the size and shape of your heart and how well your heart's chambers and valves are working.   You may receive an ultrasound enhancing agent through an IV if needed to better visualize your heart during the echo. This procedure takes approximately one hour.  There are no restrictions for this procedure.  This will take place at [address] [facility]) [address], [place] [address]  Please note: We ask at that you not bring children with you during ultrasound (echo/ vascular) testing. Due to room size and safety concerns, children are not allowed in the ultrasound rooms during exams. Our front office staff cannot provide observation of children in our lobby area while testing is being conducted. An adult accompanying a patient to their appointment will only be allowed in the ultrasound room at the discretion of the ultrasound technician under special circumstances. We apologize for any inconvenience.   Follow-Up: At [facility], you and your health needs are our priority.  As part of our continuing mission  to provide you with exceptional heart care, our providers are all part of one team.  This team includes your primary Cardiologist (physician) and Advanced Practice Providers or APPs (Physician Assistants and Nurse Practitioners) who all work together to provide you with the care you need, when you need it.  Your next appointment:   3 month(s)  Provider:   You may see Dr Darliss or one of the following Advanced Practice Providers on your designated Care Team:   Lonni Meager, NP Lesley Maffucci, PA-C Bernardino Bring, PA-C Cadence [place], PA-C Tylene Lunch, NP Barnie Hila, NP    We recommend signing up for the patient portal called MyChart.  Sign up information is provided on this After Visit Summary.  MyChart is used to connect with patients for Virtual Visits (Telemedicine).  Patients are able to view lab/test results, encounter notes, upcoming appointments, etc.  Non-urgent messages can be sent to your provider as well.   To learn more about what you can do with MyChart, go to [email or web address].             Signed, Redell Darliss, MD  12/26/2023 3:00 PM    [facility]

## 2023-12-31 ENCOUNTER — Ambulatory Visit (INDEPENDENT_AMBULATORY_CARE_PROVIDER_SITE_OTHER): Admitting: Podiatry

## 2023-12-31 DIAGNOSIS — M79676 Pain in unspecified toe(s): Secondary | ICD-10-CM | POA: Diagnosis not present

## 2023-12-31 DIAGNOSIS — B351 Tinea unguium: Secondary | ICD-10-CM | POA: Diagnosis not present

## 2023-12-31 NOTE — Progress Notes (Signed)
 Subjective:  Patient ID: Richard Cervantes, male    DOB: 05/20/1931,  MRN: [id number] HPI Chief Complaint  Patient presents with   Toe Pain    NP - routine nail care, overgrown (Pt last seen 04/23/2018). [facility]. Denies being diabetic.     89 y.o. male presents with the above complaint.   ROS: Denies fever chills nausea mobic muscle aches pains calf pain back pain chest pain shortness of breath.  Past Medical History:  Diagnosis Date   Arthritis    knees   COVID-19 03/07/2020   Gout    Hypertension    Past Surgical History:  Procedure Laterality Date   CATARACT EXTRACTION W/PHACO Right 03/22/2020   Procedure: CATARACT EXTRACTION PHACO AND INTRAOCULAR LENS PLACEMENT (IOC) RIGHT 13.10 01:35.8 13.7%;  Surgeon: Mittie Gaskin, MD;  Location: [facility];  Service: Ophthalmology;  Laterality: Right;  COVID + 03-07-20   CATARACT EXTRACTION W/PHACO Left 05/03/2020   Procedure: CATARACT EXTRACTION PHACO AND INTRAOCULAR LENS PLACEMENT (IOC) LEFT 5.79 00:49.8 11.6%;  Surgeon: Mittie Gaskin, MD;  Location: [facility];  Service: Ophthalmology;  Laterality: Left;  COVID + 03-07-20   CHOLECYSTECTOMY     HERNIA REPAIR     KIDNEY STONE SURGERY      Current Outpatient Medications:    allopurinol  (ZYLOPRIM ) 100 MG tablet, Take 0.5 tablets (50 mg total) by mouth daily., Disp: 30 tablet, Rfl: 0   amLODipine  (NORVASC ) 5 MG tablet, Take 1 tablet (5 mg total) by mouth daily., Disp: 30 tablet, Rfl: 3   ASPIRIN 81 PO, Take by mouth daily., Disp: , Rfl:    atorvastatin (LIPITOR) 80 MG tablet, Take 80 mg by mouth daily., Disp: , Rfl:    carvedilol  (COREG ) 6.25 MG tablet, Take 6.25 mg by mouth 2 (two) times daily with a meal., Disp: , Rfl:    cholecalciferol (VITAMIN D3) 25 MCG (1000 UNIT) tablet, Take 1,000 Units by mouth daily., Disp: , Rfl:    colchicine  0.6 MG tablet, Take 0.5 tablets (0.3 mg total) by mouth daily., Disp: 15 tablet, Rfl: 0   enalapril (VASOTEC) 2.5 MG tablet,  Take 2.5 mg by mouth daily., Disp: , Rfl:    feeding supplement, ENSURE ENLIVE, (ENSURE ENLIVE) LIQD, Take 237 mLs by mouth 2 (two) times daily between meals., Disp: 60 Bottle, Rfl: 0   fluticasone (FLONASE) 50 MCG/ACT nasal spray, Place into both nostrils daily., Disp: , Rfl:    furosemide (LASIX) 40 MG tablet, Take 1 tablet (40 mg total) by mouth daily., Disp: 30 tablet, Rfl: 3   HYDROcodone -acetaminophen  (NORCO/VICODIN) 5-325 MG tablet, Take One tab PO Q 6 hours PRN pain, Disp: , Rfl:    meclizine  (ANTIVERT ) 25 MG tablet, Take 1 tablet (25 mg total) by mouth 3 (three) times daily as needed for dizziness., Disp: 30 tablet, Rfl: 0   meloxicam (MOBIC) 7.5 MG tablet, Take 7.5 mg by mouth daily., Disp: , Rfl:    silodosin  (RAPAFLO ) 4 MG CAPS capsule, TAKE 1 CAPSULE (4 MG TOTAL) BY MOUTH DAILY WITH BREAKFAST., Disp: 90 capsule, Rfl: 1  Allergies  Allergen Reactions   Lactose    Lactose Intolerance (Gi)    Review of Systems Objective:  There were no vitals filed for this visit.  General: Well developed, nourished, in no acute distress, alert and oriented x3   Dermatological: Skin is warm, dry and xerotic. Nails x 10 are grossly elongated thickened dystrophic clinically mycotic and poorly kept.  Remaining integument appears unremarkable at this time. There are no  open sores, no preulcerative lesions, no rash or signs of infection present.  Vascular: Dorsalis Pedis artery and Posterior Tibial artery pedal pulses are 2/4 bilateral with immedate capillary fill time. Pedal hair growth present. No varicosities and no lower extremity edema present bilateral.   Neruologic: Grossly intact via light touch bilateral. Vibratory intact via tuning fork bilateral. Protective threshold with Semmes Wienstein monofilament intact to all pedal sites bilateral. Patellar and Achilles deep tendon reflexes 2+ bilateral. No Babinski or clonus noted bilateral.   Musculoskeletal: No gross boney pedal deformities  bilateral. No pain, crepitus, or limitation noted with foot and ankle range of motion bilateral. Muscular strength 5/5 in all groups tested bilateral.  Gait: Unassisted, Nonantalgic.    Radiographs:  None taken  Assessment & Plan:   Assessment: Pain in limb secondary to onychomycosis.  Plan: Debridement of toenails 1 through 5 bilateral.     Ladon Vandenberghe T. Dobbins, [place]

## 2024-02-20 ENCOUNTER — Ambulatory Visit: Attending: Cardiology

## 2024-02-20 DIAGNOSIS — R011 Cardiac murmur, unspecified: Secondary | ICD-10-CM | POA: Diagnosis not present

## 2024-02-20 LAB — ECHOCARDIOGRAM COMPLETE
AR max vel: 1.07 cm2
AV Area VTI: 1.31 cm2
AV Area mean vel: 1.16 cm2
AV Mean grad: 13 mmHg
AV Peak grad: 20.4 mmHg
Ao pk vel: 2.26 m/s
Area-P 1/2: 4.86 cm2
S' Lateral: 1.9 cm

## 2024-03-01 ENCOUNTER — Ambulatory Visit: Payer: Self-pay | Admitting: Cardiology

## 2024-03-25 ENCOUNTER — Other Ambulatory Visit: Payer: Self-pay | Admitting: Cardiology

## 2024-04-02 ENCOUNTER — Ambulatory Visit: Admitting: Cardiology

## 2024-04-08 ENCOUNTER — Ambulatory Visit: Admitting: Podiatry

## 2024-05-21 ENCOUNTER — Ambulatory Visit: Admitting: Physician Assistant
